# Patient Record
Sex: Male | Born: 1970 | Race: Black or African American | Hispanic: No | Marital: Single | Smoking: Former smoker
Health system: Southern US, Community
[De-identification: ages and names within clinical notes are randomized; demographics above are authoritative.]

## PROBLEM LIST (undated history)

## (undated) DIAGNOSIS — J45909 Unspecified asthma, uncomplicated: Secondary | ICD-10-CM

## (undated) DIAGNOSIS — I10 Essential (primary) hypertension: Secondary | ICD-10-CM

---

## 2005-10-06 ENCOUNTER — Emergency Department: Payer: Self-pay | Admitting: General Practice

## 2006-03-24 ENCOUNTER — Emergency Department: Payer: Self-pay | Admitting: Emergency Medicine

## 2006-03-24 ENCOUNTER — Other Ambulatory Visit: Payer: Self-pay

## 2008-06-22 ENCOUNTER — Ambulatory Visit: Payer: Self-pay | Admitting: Gastroenterology

## 2016-12-03 ENCOUNTER — Emergency Department: Payer: Self-pay

## 2016-12-03 ENCOUNTER — Encounter: Payer: Self-pay | Admitting: *Deleted

## 2016-12-03 ENCOUNTER — Other Ambulatory Visit: Payer: Self-pay

## 2016-12-03 ENCOUNTER — Emergency Department
Admission: EM | Admit: 2016-12-03 | Discharge: 2016-12-03 | Disposition: A | Discharge status: Home / Self Care | Payer: Self-pay | Attending: Emergency Medicine | Admitting: Emergency Medicine

## 2016-12-03 DIAGNOSIS — R002 Palpitations: Secondary | ICD-10-CM | POA: Insufficient documentation

## 2016-12-03 DIAGNOSIS — F17210 Nicotine dependence, cigarettes, uncomplicated: Secondary | ICD-10-CM | POA: Insufficient documentation

## 2016-12-03 DIAGNOSIS — J45909 Unspecified asthma, uncomplicated: Secondary | ICD-10-CM | POA: Insufficient documentation

## 2016-12-03 HISTORY — DX: Unspecified asthma, uncomplicated: J45.909

## 2016-12-03 LAB — TROPONIN I

## 2016-12-03 LAB — BASIC METABOLIC PANEL
ANION GAP: 9 (ref 5–15)
BUN: 11 mg/dL (ref 6–20)
CALCIUM: 9.4 mg/dL (ref 8.9–10.3)
CO2: 24 mmol/L (ref 22–32)
Chloride: 108 mmol/L (ref 101–111)
Creatinine, Ser: 1.06 mg/dL (ref 0.61–1.24)
GFR calc Af Amer: >60 mL/min (ref >60)
Glucose, Bld: 92 mg/dL (ref 65–99)
POTASSIUM: 3.8 mmol/L (ref 3.5–5.1)
SODIUM: 141 mmol/L (ref 135–145)

## 2016-12-03 LAB — CBC
HEMATOCRIT: 47.5 % (ref 40.0–52.0)
Hemoglobin: 15.4 g/dL (ref 13.0–18.0)
MCH: 29.3 pg (ref 26.0–34.0)
MCHC: 32.4 g/dL (ref 32.0–36.0)
MCV: 90.5 fL (ref 80.0–100.0)
Platelets: 205 10*3/uL (ref 150–440)
RBC: 5.26 MIL/uL (ref 4.40–5.90)
RDW: 14 % (ref 11.5–14.5)
WBC: 8.9 10*3/uL (ref 3.8–10.6)

## 2016-12-03 LAB — TSH: TSH: 1.019 u[IU]/mL (ref 0.350–4.500)

## 2016-12-03 NOTE — Discharge Instructions (Signed)
Please make an appointment with the cardiologist to be reevaluated for your palpitations and to have a Holter monitor placed.  Return to the emergency department if you develop lightheadedness or fainting, chest pain, shortness of breath, palpitations, or any other symptoms concerning to you.

## 2016-12-03 NOTE — ED Provider Notes (Signed)
[facility] Emergency Department Provider Note  ____________________________________________  Time seen: Approximately 3:11 PM  I have reviewed the triage vital signs and the nursing notes.   HISTORY  Chief Complaint Palpitations    HPI Jeremy Hodges is a 46 y.o. male with a history of asthma presenting with palpitations.  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  He then has had multiple shorter intermittent similar episodes since then.  He denies any associated lightheadedness or syncope, diaphoresis, nausea or vomiting.  No recent illness; denies drug abuse.  No lower externally swelling or calf pain.  At this time, the patient is asymptomatic.  He reports that several years ago he had an Holter monitor placed in [place] for similar symptoms and was told it was normal.  Past Medical History:  Diagnosis Date  . Asthma     There are no active problems to display for this patient.   History reviewed. No pertinent surgical history.    Allergies Patient has no allergy information on record.  History reviewed. No pertinent family history.  Social History Social History   Tobacco Use  . Smoking status: Current Every Day Smoker    Packs/day: 0.50    Types: Cigarettes  . Smokeless tobacco: Never Used  Substance Use Topics  . Alcohol use: Yes    Frequency: Never    Comment: occasionally  . Drug use: No    Review of Systems Constitutional: No fever/chills.  No lightheadedness or syncope..  No diaphoresis. Eyes: No visual changes. ENT: No sore throat. No congestion or rhinorrhea. Cardiovascular: Denies chest pain.  + palpitations. Respiratory: Denies shortness of breath.  No cough. Gastrointestinal: No abdominal pain.  No  nausea, no vomiting.  No diarrhea.  No constipation. Genitourinary: Negative for dysuria. Musculoskeletal: Negative for back pain. Skin: Negative for rash. Neurological: Negative for headaches. No focal numbness, tingling or weakness.     ____________________________________________   PHYSICAL EXAM:  VITAL SIGNS: ED Triage Vitals  Enc Vitals Group     BP 12/03/16 1203 (!) 156/98     Pulse Rate 12/03/16 1203 100     Resp 12/03/16 1203 16     Temp 12/03/16 1203 98 F (36.7 C)     Temp Source 12/03/16 1203 Oral     SpO2 12/03/16 1203 94 %     Weight 12/03/16 1204 194 lb (88 kg)     Height 12/03/16 1204 5\' 5"  (1.651 m)     Head Circumference --      Peak Flow --      Pain Score --      Pain Loc --      Pain Edu? --      Excl. in GC? --     Constitutional: Alert and oriented. Well appearing and in no acute distress. Answers questions appropriately. Eyes: Conjunctivae are normal.  EOMI. No scleral icterus. Head: Atraumatic. Nose: No congestion/rhinnorhea. Mouth/Throat: Mucous membranes are moist.  Neck: No stridor.  Supple.  No JVD.  No meningismus. Cardiovascular: Normal rate, regular rhythm. No murmurs, rubs or gallops.  Respiratory: Normal respiratory effort.  No accessory muscle use or retractions. Lungs CTAB.  No wheezes, rales or ronchi. Gastrointestinal: Soft, nontender and nondistended.  No guarding or rebound.  No peritoneal signs. Musculoskeletal: No LE edema. No ttp in the calves or palpable cords.  Negative Homan's sign. Neurologic:  A&Ox3.  Speech is clear.  Face and smile are symmetric.  EOMI.  Moves all extremities well. Skin:  Skin is warm, dry and intact. No rash noted. Psychiatric: Mood and affect are normal. Speech and behavior are normal.  Normal judgement.  ____________________________________________   LABS (all labs ordered are listed, but only abnormal results are displayed)  Labs Reviewed  BASIC METABOLIC PANEL  CBC  TROPONIN I  TSH    ____________________________________________  EKG  ED ECG REPORT I, Rockne MenghiniNorman, Anne-Caroline, the attending physician, personally viewed and interpreted this ECG.   Date: 12/03/2016  EKG Time: 1137  Rate: 100  Rhythm: sinus tachycardia  Axis: leftward  Intervals:none  ST&T Change: No STEMI  ____________________________________________  RADIOLOGY  Dg Chest 2 View  Result Date: 12/03/2016 CLINICAL DATA:  Chest pain, palpitations EXAM: CHEST  2 VIEW COMPARISON:  03/24/2006 FINDINGS: Lungs are clear.  No pleural effusion or pneumothorax. The heart is normal in size. Visualized osseous structures are within normal limits. IMPRESSION: Normal chest radiographs. Electronically Signed   By: Charline BillsSriyesh  Krishnan M.D.   On: 12/03/2016 13:04    ____________________________________________   PROCEDURES  Procedure(s) performed: None  Procedures  Critical Care performed: No ____________________________________________   INITIAL IMPRESSION / ASSESSMENT AND PLAN / ED COURSE  Pertinent labs & imaging results that were available during my care of the patient were reviewed by me and considered in my medical decision making (see chart for details).  46 y.o. male with a history of asthma presenting with multiple episodes of self resolving palpitations this morning.  Overall, the patient is hemodynamically stable.  His EKG shows a sinus tachycardia at a rate of 100, but on my examination his heart rate has normalized.  His laboratory studies are reassuring including normal electrolytes, no evidence of anemia, and a negative troponin.  His chest x-ray also does not show any acute cardiopulmonary process.  It is possible that he is having intermittent arrhythmias, including PVCs PACs or SVT, less likely intermittent atrial fibrillation.  I will speak to the cardiologist on call to have the patient evaluated for Holter placement as an outpatient.  I have also added on a TSH, which can be followed up  at the same time.  At this time, the patient is stable for discharge and remains asymptomatic.  He understands return precautions as well as follow-up instructions  ____________________________________________  FINAL CLINICAL IMPRESSION(S) / ED DIAGNOSES  Final diagnoses:  Palpitations         NEW MEDICATIONS STARTED DURING THIS VISIT:  This SmartLink is deprecated. Use AVSMEDLIST instead to display the medication list for a patient.    Rockne MenghiniNorman, Anne-Caroline, MD 12/03/16 1517

## 2016-12-03 NOTE — ED Triage Notes (Signed)
PT to ED reporting having multiple episodes  of palpitations and feeling as though "my heart was racing and beating out of my chest." Pt denies having chest pains or SOB but reports he had blurred vision and lightheadedness at one point but reports those symptoms only lasted until he sat down and rested.   Pt reports he had been drinking this weekend but denies having taken drugs recently or today. No increased stress reported.

## 2017-01-26 DIAGNOSIS — I479 Paroxysmal tachycardia, unspecified: Secondary | ICD-10-CM | POA: Insufficient documentation

## 2017-01-26 DIAGNOSIS — F17200 Nicotine dependence, unspecified, uncomplicated: Secondary | ICD-10-CM | POA: Insufficient documentation

## 2017-01-26 DIAGNOSIS — R002 Palpitations: Secondary | ICD-10-CM | POA: Insufficient documentation

## 2017-01-26 NOTE — Progress Notes (Deleted)
Cardiology Office Note  Date:  01/26/2017   ID:  Jeremy Hodges, DOB 09-Aug-1970, MRN [id number]  PCP:  Patient, No Pcp Per   No chief complaint on file.   HPI:   Jeremy Hodges is a 47 y.o. male with a history of  asthma  Smoker HTN presenting to the ER 12/03/2016 with palpitations.     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    multiple shorter intermittent similar episodes since then.   denies any associated lightheadedness or syncope, diaphoresis, nausea or vomiting.    No recent illness; denies drug abuse.  No lower externally swelling or calf pain.  At this time, the patient is asymptomatic.    He reports that several years ago he had an Holter monitor placed in [place] for similar symptoms and was told it was normal.   PMH:   has a past medical history of Asthma.  PSH:   No past surgical history on file.  No current outpatient medications on file.   No current facility-administered medications for this visit.      Allergies:   Patient has no allergy information on record.   Social History:  The patient  reports that he has been smoking cigarettes.  He has been smoking about 0.50 packs per day. he has never used smokeless tobacco. He reports that he drinks alcohol. He reports that he does not use drugs.   Family History:   family history is not on file.    Review of Systems: ROS   PHYSICAL EXAM: VS:  There were no vitals taken for this visit. , BMI There is no height or weight on file to calculate BMI. GEN: Well nourished, well developed, in no acute distress HEENT: normal Neck: no JVD, carotid bruits, or masses Cardiac: RRR; no murmurs, rubs, or gallops,no edema  Respiratory:  clear to auscultation bilaterally, normal work of breathing GI: soft, nontender,  nondistended, + BS MS: no deformity or atrophy Skin: warm and dry, no rash Neuro:  Strength and sensation are intact Psych: euthymic mood, full affect    Recent Labs: 12/03/2016: BUN 11; Creatinine, Ser 1.06; Hemoglobin 15.4; Platelets 205; Potassium 3.8; Sodium 141; TSH 1.019    Lipid Panel No results found for: CHOL, HDL, LDLCALC, TRIG    Wt Readings from Last 3 Encounters:  12/03/16 194 lb (88 kg)       ASSESSMENT AND PLAN:  No diagnosis found.   Disposition:   F/U  6 months  No orders of the defined types were placed in this encounter.    Signed, Dossie Arbourim Cresencia Asmus, M.D., Ph.D. 01/26/2017  [facility], [place] [phone]

## 2017-01-28 ENCOUNTER — Ambulatory Visit: Payer: Self-pay | Admitting: Cardiovascular Disease

## 2017-03-07 ENCOUNTER — Ambulatory Visit: Payer: Self-pay | Admitting: Cardiovascular Disease

## 2017-03-08 ENCOUNTER — Emergency Department
Admission: EM | Admit: 2017-03-08 | Discharge: 2017-03-08 | Disposition: A | Discharge status: Home / Self Care | Payer: Self-pay | Attending: Emergency Medicine | Admitting: Emergency Medicine

## 2017-03-08 ENCOUNTER — Encounter: Payer: Self-pay | Admitting: Emergency Medicine

## 2017-03-08 ENCOUNTER — Other Ambulatory Visit: Payer: Self-pay

## 2017-03-08 DIAGNOSIS — Z79899 Other long term (current) drug therapy: Secondary | ICD-10-CM | POA: Insufficient documentation

## 2017-03-08 DIAGNOSIS — T7840XA Allergy, unspecified, initial encounter: Secondary | ICD-10-CM | POA: Insufficient documentation

## 2017-03-08 DIAGNOSIS — Z91013 Allergy to seafood: Secondary | ICD-10-CM | POA: Insufficient documentation

## 2017-03-08 DIAGNOSIS — J45909 Unspecified asthma, uncomplicated: Secondary | ICD-10-CM | POA: Insufficient documentation

## 2017-03-08 DIAGNOSIS — F17210 Nicotine dependence, cigarettes, uncomplicated: Secondary | ICD-10-CM | POA: Insufficient documentation

## 2017-03-08 DIAGNOSIS — R0602 Shortness of breath: Secondary | ICD-10-CM | POA: Insufficient documentation

## 2017-03-08 LAB — COMPREHENSIVE METABOLIC PANEL
ALT: 17 U/L (ref 17–63)
ANION GAP: 8 (ref 5–15)
AST: 25 U/L (ref 15–41)
Albumin: 4.5 g/dL (ref 3.5–5.0)
Alkaline Phosphatase: 56 U/L (ref 38–126)
BUN: 10 mg/dL (ref 6–20)
CO2: 28 mmol/L (ref 22–32)
CREATININE: 1.07 mg/dL (ref 0.61–1.24)
Calcium: 9.2 mg/dL (ref 8.9–10.3)
Chloride: 104 mmol/L (ref 101–111)
Glucose, Bld: 94 mg/dL (ref 65–99)
Potassium: 4 mmol/L (ref 3.5–5.1)
SODIUM: 140 mmol/L (ref 135–145)
Total Bilirubin: 1.2 mg/dL (ref 0.3–1.2)
Total Protein: 7.7 g/dL (ref 6.5–8.1)

## 2017-03-08 LAB — CBC
HCT: 47.1 % (ref 40.0–52.0)
Hemoglobin: 15.2 g/dL (ref 13.0–18.0)
MCH: 29.1 pg (ref 26.0–34.0)
MCHC: 32.2 g/dL (ref 32.0–36.0)
MCV: 90.2 fL (ref 80.0–100.0)
PLATELETS: 200 10*3/uL (ref 150–440)
RBC: 5.22 MIL/uL (ref 4.40–5.90)
RDW: 13.7 % (ref 11.5–14.5)
WBC: 8.4 10*3/uL (ref 3.8–10.6)

## 2017-03-08 MED ORDER — PREDNISONE 20 MG PO TABS: 20.0000 mg | ORAL_TABLET | Freq: Every day | ORAL | 0 refills | Status: AC

## 2017-03-08 MED ORDER — DIPHENHYDRAMINE HCL 50 MG/ML IJ SOLN
25.0000 mg | Freq: Once | INTRAMUSCULAR | Status: AC
  Administered 2017-03-08: 25 mg via INTRAMUSCULAR

## 2017-03-08 MED ORDER — FAMOTIDINE 20 MG IN NS 100 ML IVPB
20.0000 mg | Freq: Once | INTRAVENOUS | Status: AC
  Administered 2017-03-08: 20 mg via INTRAVENOUS
  Filled 2017-03-08: qty 100

## 2017-03-08 MED ORDER — METHYLPREDNISOLONE SODIUM SUCC 125 MG IJ SOLR: 125.0000 mg | Freq: Once | INTRAMUSCULAR | Status: DC

## 2017-03-08 MED ORDER — DIPHENHYDRAMINE HCL 50 MG/ML IJ SOLN
INTRAMUSCULAR | Status: AC
  Filled 2017-03-08: qty 1

## 2017-03-08 MED ORDER — RANITIDINE HCL 300 MG PO TABS: 300.0000 mg | ORAL_TABLET | Freq: Every day | ORAL | 1 refills | Status: DC

## 2017-03-08 MED ORDER — EPINEPHRINE PF 1 MG/ML IJ SOLN
INTRAMUSCULAR | Status: AC
  Filled 2017-03-08: qty 1

## 2017-03-08 MED ORDER — PREDNISONE 20 MG PO TABS
60.0000 mg | ORAL_TABLET | Freq: Once | ORAL | Status: AC
  Administered 2017-03-08: 60 mg via ORAL
  Filled 2017-03-08: qty 3

## 2017-03-08 MED ORDER — DIPHENHYDRAMINE HCL 50 MG/ML IJ SOLN: 25.0000 mg | Freq: Once | INTRAMUSCULAR | Status: DC

## 2017-03-08 MED ORDER — METHYLPREDNISOLONE SODIUM SUCC 125 MG IJ SOLR
INTRAMUSCULAR | Status: AC
  Administered 2017-03-08: 125 mg via INTRAMUSCULAR
  Filled 2017-03-08: qty 2

## 2017-03-08 MED ORDER — METHYLPREDNISOLONE SODIUM SUCC 125 MG IJ SOLR
125.0000 mg | Freq: Once | INTRAMUSCULAR | Status: AC
  Administered 2017-03-08: 125 mg via INTRAMUSCULAR

## 2017-03-08 MED ORDER — DIPHENHYDRAMINE HCL 50 MG/ML IJ SOLN
25.0000 mg | Freq: Once | INTRAMUSCULAR | Status: DC
Start: 2017-03-08 — End: 2017-03-08

## 2017-03-08 MED ORDER — DIPHENHYDRAMINE HCL 50 MG/ML IJ SOLN
INTRAMUSCULAR | Status: AC
  Administered 2017-03-08: 25 mg via INTRAMUSCULAR
  Filled 2017-03-08: qty 1

## 2017-03-08 NOTE — ED Notes (Signed)
Pt out of bed to bathroom, feels some improved, swelling has decreased in lip and eye

## 2017-03-08 NOTE — ED Notes (Addendum)
Pt reporting N/V, SHOB and wheezing, oral swelling, swelling to left throat painful to swallow and right eye since last night, progressively worse. Pt denies taking meds.

## 2017-03-08 NOTE — Discharge Instructions (Signed)
please return for fever or worse swelling pain and shortness of breath or any other problems. If you have worsening shortness of breath call the ambulance.  In the meantime for the next 2 days take Benadryl every 6 hours. 2 of the over-the-counter pills every 6 hours. Be careful they will make you sleepy do not drive on them. Take Zantac 1 pill a day and prednisone one pill a day. The last dose of the Zantac and the Benadryl should be Sunday night. Last dose of the prednisone will be on Monday night.

## 2017-03-08 NOTE — ED Notes (Signed)
Upon entering patient's room.  Pt is lethargic from first dose of benadryl.  Pt breathing even and non-labored at this time. EDP notified that this RN uncomfortable giving second dose of benadryl at this time.  EDP okay with holding.

## 2017-03-08 NOTE — ED Triage Notes (Addendum)
Awoke with painful upper lip swelling this am. States does not take any medications. Denies injury. Voice clear. No resp distress.

## 2017-03-08 NOTE — ED Notes (Signed)
Pt remains sleepy but arouses to verbal stimuli, when awakened states "I still don't feel right, my lip hurts"

## 2017-03-08 NOTE — ED Notes (Signed)
Report given to [facility], Charity fundraiserN.  Pt moved to room 2 and care resumed by Dr. Darnelle CatalanMalinda and Bascom Levelsenise Jones, RN

## 2017-03-08 NOTE — ED Provider Notes (Signed)
[facility] Emergency Department Provider Note   ____________________________________________   First MD Initiated Contact with Patient 03/08/17 1034     (approximate)  I have reviewed the triage vital signs and the nursing notes.   HISTORY  Chief Complaint Oral Swelling   HPI Jeremy Hodges is a 47 y.o. male Patient comes from [facility] for further treatment. Patient reports he went to the hotel and [place] when he woke up in the morning had a little bump on the left upper lip. Since then and swollen around the left upper lip his right upper eyelid is swollen and he has swelling on the side of the neck. He thinks maybe he was little short of breath as well. In [facility] he got Benadryl Pepcid and site Medrol. Swelling has decreased somewhat here in the emergency room patient's voice was never horses tongue was never swollen there is no swelling underneath of the mandible.he is not running a fever and his white count is normal. Looks like he had an allergic reaction to something.  Past Medical History:  Diagnosis Date  . Asthma     Patient Active Problem List   Diagnosis Date Noted  . Palpitations 01/26/2017  . Paroxysmal tachycardia (HCC) 01/26/2017  . Smoker 01/26/2017    History reviewed. No pertinent surgical history.  Prior to Admission medications   Medication Sig Start Date End Date Taking? Authorizing Provider  predniSONE (DELTASONE) 20 MG tablet Take 1 tablet (20 mg total) by mouth daily. 03/08/17 03/08/18  Arnaldo Natal, MD  ranitidine (ZANTAC) 300 MG tablet Take 1 tablet (300 mg total) by mouth at bedtime. 03/08/17 03/08/18  Arnaldo Natal, MD    Allergies Iodine; Shrimp [shellfish allergy]; and Aspirin  No family history on file.  Social History Social History   Tobacco Use  . Smoking status: Current Every Day Smoker    Packs/day: 0.50    Types: Cigarettes  . Smokeless tobacco: Never Used  Substance Use Topics  .  Alcohol use: Yes    Frequency: Never    Comment: occasionally  . Drug use: No    Review of Systems  Constitutional: No fever/chills Eyes: No visual changes. ENT: No sore throat. Cardiovascular: Denies chest pain. Respiratory: see history of present illness Gastrointestinal: No abdominal pain.  No nausea, no vomiting.  No diarrhea.  No constipation. Genitourinary: Negative for dysuria. Musculoskeletal: Negative for back pain. Skin: Negative for rash. Neurological: Negative for headaches, focal weakness   ____________________________________________   PHYSICAL EXAM:  VITAL SIGNS: ED Triage Vitals  Enc Vitals Group     BP 03/08/17 0942 (!) 174/100     Pulse Rate 03/08/17 0942 77     Resp 03/08/17 0942 20     Temp 03/08/17 0942 98 F (36.7 C)     Temp Source 03/08/17 0942 Oral     SpO2 03/08/17 0942 99 %     Weight 03/08/17 0943 183 lb (83 kg)     Height 03/08/17 0943 5\' 5"  (1.651 m)     Head Circumference --      Peak Flow --      Pain Score 03/08/17 0943 7     Pain Loc --      Pain Edu? --      Excl. in GC? --     Constitutional: Alert and oriented. Well appearing and in no acute distress. Eyes: Conjunctivae are normal. right eyelid is somewhat edematous Head: Atraumatic.left upper lip is somewhat swollen  almost looks like he took an ACE inhibitor. Nose: No congestion/rhinnorhea. Mouth/Throat: Mucous membranes are moist.  Oropharynx non-erythematous. Neck: No stridor.   Cardiovascular: Normal rate, regular rhythm. Grossly normal heart sounds.  Good peripheral circulation. Respiratory: Normal respiratory effort.  No retractions. Lungs CTAB. Gastrointestinal: Soft and nontender. No distention. No abdominal bruits. No CVA tenderness. Musculoskeletal: No lower extremity tenderness nor edema.  No joint effusions. Neurologic:  Normal speech and language. No gross focal neurologic deficits are appreciated. No gait instability. Skin:  Skin is warm, dry and intact. No rash  noted. Psychiatric: Mood and affect are normal. Speech and behavior are normal.  ____________________________________________   LABS (all labs ordered are listed, but only abnormal results are displayed)  Labs Reviewed  CBC  COMPREHENSIVE METABOLIC PANEL   ____________________________________________  EKG   ____________________________________________  RADIOLOGY  ED MD interpretation:    Official radiology report(s): No results found.  ____________________________________________   PROCEDURES  Procedure(s) performed:   Procedures  Critical Care performed:   ____________________________________________   INITIAL IMPRESSION / ASSESSMENT AND PLAN / ED COURSE  old records were reviewed. Patient was observed in the ER for quite some time swelling improves. Patient is not worse this O2 sats do not change is not having any stridor or wheezing. I will let him go with Benadryl's Zantac and prednisone. He is not working until Monday. He seems most concerned about appearing "ugly" when he returns to work.        ____________________________________________   FINAL CLINICAL IMPRESSION(S) / ED DIAGNOSES  Final diagnoses:  Allergic reaction, initial encounter     ED Discharge Orders        Ordered    ranitidine (ZANTAC) 300 MG tablet  Daily at bedtime     03/08/17 1507    predniSONE (DELTASONE) 20 MG tablet  Daily     03/08/17 1507       Note:  This document was prepared using Dragon voice recognition software and may include unintentional dictation errors.    Arnaldo NatalMalinda, Paul F, MD 03/08/17 [phone]

## 2017-03-08 NOTE — ED Notes (Signed)
Pt alert and oriented but very sleepy, resting with eyes close, resp. Even and non labored

## 2017-03-08 NOTE — ED Notes (Signed)
Spoke with EDP, Morrie SheldonAshley regarding patient stating that [facility] and feels like his neck is also getting swollen.  VO given for another 25mg  of benadryl IV push and to move patient to higher level of care room.  Charge nurse Gearldine BienenstockBrandy, RN notified.

## 2017-03-12 NOTE — Progress Notes (Deleted)
Cardiology Office Note  Date:  03/12/2017   ID:  Jeremy Hodges, DOB 10-04-1970, MRN [id number]  PCP:  Patient, No Pcp Per   No chief complaint on file.   HPI:   Asthma Smoker HTN Tachycardia/palpitations  In the ER 12/03/2016  presenting with palpitations.   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    He then has had multiple shorter intermittent similar episodes since then.    He denies any associated lightheadedness or syncope, diaphoresis, nausea or vomiting.    everal years ago he had an Holter monitor placed in [place] for similar symptoms and was told it was normal.     PMH:   has a past medical history of Asthma.  PSH:   No past surgical history on file.  Current Outpatient Medications  Medication Sig Dispense Refill  . predniSONE (DELTASONE) 20 MG tablet Take 1 tablet (20 mg total) by mouth daily. 3 tablet 0  . ranitidine (ZANTAC) 300 MG tablet Take 1 tablet (300 mg total) by mouth at bedtime. 2 tablet 1   No current facility-administered medications for this visit.      Allergies:   Iodine; Shrimp [shellfish allergy]; and Aspirin   Social History:  The patient  reports that he has been smoking cigarettes.  He has been smoking about 0.50 packs per day. he has never used smokeless tobacco. He reports that he drinks alcohol. He reports that he does not use drugs.   Family History:   family history is not on file.    Review of Systems: ROS   PHYSICAL EXAM: VS:  There were no vitals taken for this visit. , BMI There is no height or weight on file to calculate BMI. GEN: Well nourished, well developed, in no acute distress HEENT: normal Neck: no JVD, carotid bruits, or masses Cardiac: RRR; no murmurs, rubs, or gallops,no edema  Respiratory:  clear to auscultation  bilaterally, normal work of breathing GI: soft, nontender, nondistended, + BS MS: no deformity or atrophy Skin: warm and dry, no rash Neuro:  Strength and sensation are intact Psych: euthymic mood, full affect    Recent Labs: 12/03/2016: TSH 1.019 03/08/2017: ALT 17; BUN 10; Creatinine, Ser 1.07; Hemoglobin 15.2; Platelets 200; Potassium 4.0; Sodium 140    Lipid Panel No results found for: CHOL, HDL, LDLCALC, TRIG    Wt Readings from Last 3 Encounters:  03/08/17 183 lb (83 kg)  12/03/16 194 lb (88 kg)       ASSESSMENT AND PLAN:  No diagnosis found.   Disposition:   F/U  6 months  No orders of the defined types were placed in this encounter.    Signed, Dossie Arbourim Kairen Hallinan, M.D., Ph.D. 03/12/2017  [facility], [place] [phone]

## 2017-03-14 ENCOUNTER — Ambulatory Visit: Payer: Self-pay | Admitting: Cardiovascular Disease

## 2017-03-17 ENCOUNTER — Encounter: Payer: Self-pay | Admitting: Cardiovascular Disease

## 2017-03-24 ENCOUNTER — Ambulatory Visit: Payer: Self-pay | Admitting: Cardiovascular Disease

## 2017-10-20 ENCOUNTER — Other Ambulatory Visit: Payer: Self-pay

## 2017-10-20 ENCOUNTER — Emergency Department
Admission: EM | Admit: 2017-10-20 | Discharge: 2017-10-20 | Disposition: A | Discharge status: Home / Self Care | Payer: Self-pay | Attending: Emergency Medicine | Admitting: Emergency Medicine

## 2017-10-20 DIAGNOSIS — J45909 Unspecified asthma, uncomplicated: Secondary | ICD-10-CM | POA: Insufficient documentation

## 2017-10-20 DIAGNOSIS — Z79899 Other long term (current) drug therapy: Secondary | ICD-10-CM | POA: Insufficient documentation

## 2017-10-20 DIAGNOSIS — R002 Palpitations: Secondary | ICD-10-CM | POA: Insufficient documentation

## 2017-10-20 DIAGNOSIS — F17210 Nicotine dependence, cigarettes, uncomplicated: Secondary | ICD-10-CM | POA: Insufficient documentation

## 2017-10-20 LAB — CBC
HCT: 39.7 % — ABNORMAL LOW (ref 40.0–52.0)
HEMOGLOBIN: 13.5 g/dL (ref 13.0–18.0)
MCH: 30.5 pg (ref 26.0–34.0)
MCHC: 34.1 g/dL (ref 32.0–36.0)
MCV: 89.5 fL (ref 80.0–100.0)
PLATELETS: 157 10*3/uL (ref 150–440)
RBC: 4.43 MIL/uL (ref 4.40–5.90)
RDW: 13.6 % (ref 11.5–14.5)
WBC: 7.5 10*3/uL (ref 3.8–10.6)

## 2017-10-20 LAB — BASIC METABOLIC PANEL
Anion gap: 9 (ref 5–15)
BUN: 24 mg/dL — ABNORMAL HIGH (ref 6–20)
CHLORIDE: 111 mmol/L (ref 98–111)
CO2: 23 mmol/L (ref 22–32)
CREATININE: 1.54 mg/dL — AB (ref 0.61–1.24)
Calcium: 8.8 mg/dL — ABNORMAL LOW (ref 8.9–10.3)
GFR calc non Af Amer: 53 mL/min — ABNORMAL LOW (ref >60)
Glucose, Bld: 110 mg/dL — ABNORMAL HIGH (ref 70–99)
Potassium: 3.6 mmol/L (ref 3.5–5.1)
SODIUM: 143 mmol/L (ref 135–145)

## 2017-10-20 LAB — TROPONIN I

## 2017-10-20 NOTE — ED Triage Notes (Signed)
Pt complains of palpitations and dizziness for "months". Pt was asleep prior to triage, had to be awoken for assessment. Pt denies pain and appears in no acute distress.

## 2017-10-20 NOTE — ED Notes (Signed)
Pt arrives via [facility] with c/o palpitations at work. Per EMS, pt was on the phone with his significant other and became upset. EMS reports pulse rate 112and other VS WNL. Pt is in NAD.

## 2017-10-20 NOTE — ED Provider Notes (Signed)
[facility] Emergency Department Provider Note  Time seen: 4:06 AM  I have reviewed the triage vital signs and the nursing notes.   HISTORY  Chief Complaint Palpitations    HPI Jeremy Hodges is a 47 y.o. male with a past medical history of palpitations, presents to the emergency department for heart palpitations.  According to the patient over the past 2 to 3 months he has been expensing intermittent palpitations.  States around 21 tonight when going to work palpitations became constant and more significant he became concerned so he came to the emergency department for evaluation.  Denies any chest pain.  Does state shortness of breath when the palpitations are occurring.  Denies any abdominal pain.  Largely negative review of systems.   Past Medical History:  Diagnosis Date  . Asthma     Patient Active Problem List   Diagnosis Date Noted  . Palpitations 01/26/2017  . Paroxysmal tachycardia (HCC) 01/26/2017  . Smoker 01/26/2017    No past surgical history on file.  Prior to Admission medications   Medication Sig Start Date End Date Taking? Authorizing Provider  predniSONE (DELTASONE) 20 MG tablet Take 1 tablet (20 mg total) by mouth daily. 03/08/17 03/08/18  Arnaldo Natal, MD  ranitidine (ZANTAC) 300 MG tablet Take 1 tablet (300 mg total) by mouth at bedtime. 03/08/17 03/08/18  Arnaldo Natal, MD    Allergies  Allergen Reactions  . Iodine   . Shrimp [Shellfish Allergy]   . Aspirin Rash    No family history on file.  Social History Social History   Tobacco Use  . Smoking status: Current Every Day Smoker    Packs/day: 0.50    Types: Cigarettes  . Smokeless tobacco: Never Used  Substance Use Topics  . Alcohol use: Yes    Frequency: Never    Comment: occasionally  . Drug use: No    Review of Systems Constitutional: Negative for fever. Cardiovascular: Negative for chest pain.  Positive for palpitations. Respiratory: Negative for  shortness of breath. Gastrointestinal: Negative for abdominal pain, vomiting  Genitourinary: Negative for urinary compaints Musculoskeletal: Negative for musculoskeletal complaints Skin: Negative for skin complaints  Neurological: Negative for headache All other ROS negative  ____________________________________________   PHYSICAL EXAM:  VITAL SIGNS: ED Triage Vitals [10/20/17 0111]  Enc Vitals Group     BP      Pulse      Resp      Temp      Temp src      SpO2      Weight 197 lb (89.4 kg)     Height 5\' 5"  (1.651 m)     Head Circumference      Peak Flow      Pain Score 0     Pain Loc      Pain Edu?      Excl. in GC?     Constitutional: Alert and oriented. Well appearing and in no distress. Eyes: Normal exam ENT   Head: Normocephalic and atraumatic.   Mouth/Throat: Mucous membranes are moist. Cardiovascular: Normal rate, regular rhythm. No murmur Respiratory: Normal respiratory effort without tachypnea nor retractions. Breath sounds are clear  Gastrointestinal: Soft and nontender. No distention.  Musculoskeletal: Nontender with normal range of motion in all extremities. No lower extremity tenderness or edema. Neurologic:  Normal speech and language. No gross focal neurologic deficits are appreciated. Skin:  Skin is warm, dry and intact.  Psychiatric: Mood and affect are normal. Speech and  behavior are normal.   ____________________________________________    EKG  EKG reviewed and interpreted by myself shows sinus tachycardia 105 bpm with a narrow QRS, normal axis, normal intervals, no concerning ST changes.  ____________________________________________   INITIAL IMPRESSION / ASSESSMENT AND PLAN / ED COURSE  Pertinent labs & imaging results that were available during my care of the patient were reviewed by me and considered in my medical decision making (see chart for details).  Patient presents to the emergency department for palpitations this evening.   He states when he was getting ready to go to work around 9 PM tonight he developed palpitations.  States he has been getting palpitations intermittently over the past few months but they were more severe and more constant around 9 PM last night.  Patient's labs show mild renal insufficiency otherwise largely normal.  Troponin is negative.  EKG is reassuring.  No ectopic beats auscultated during my examination.  He states he is feeling much better.  I discussed with the patient limiting caffeine intake avoiding alcohol and getting more sleep.  Patient states he does not drink alcohol much, does not use much caffeine but he only gets several hours asleep at night, which could definitely be contributing.  We will have the patient follow-up with cardiology for a Holter monitor.  Patient agreeable to plan of care.  Discussed return precautions for any chest pain, lightheadedness/dizziness.  ____________________________________________   FINAL CLINICAL IMPRESSION(S) / ED DIAGNOSES  Palpitations    Minna Antis, MD 10/20/17 [phone]

## 2018-01-01 ENCOUNTER — Emergency Department
Admission: EM | Admit: 2018-01-01 | Discharge: 2018-01-01 | Disposition: A | Discharge status: Home / Self Care | Payer: Self-pay | Attending: Emergency Medicine | Admitting: Emergency Medicine

## 2018-01-01 ENCOUNTER — Other Ambulatory Visit: Payer: Self-pay

## 2018-01-01 ENCOUNTER — Encounter: Payer: Self-pay | Admitting: Emergency Medicine

## 2018-01-01 DIAGNOSIS — K922 Gastrointestinal hemorrhage, unspecified: Secondary | ICD-10-CM | POA: Insufficient documentation

## 2018-01-01 DIAGNOSIS — J45909 Unspecified asthma, uncomplicated: Secondary | ICD-10-CM | POA: Insufficient documentation

## 2018-01-01 DIAGNOSIS — F17210 Nicotine dependence, cigarettes, uncomplicated: Secondary | ICD-10-CM | POA: Insufficient documentation

## 2018-01-01 LAB — COMPREHENSIVE METABOLIC PANEL
ALT: 17 U/L (ref 0–44)
ANION GAP: 6 (ref 5–15)
AST: 23 U/L (ref 15–41)
Albumin: 4.1 g/dL (ref 3.5–5.0)
Alkaline Phosphatase: 53 U/L (ref 38–126)
BUN: 15 mg/dL (ref 6–20)
CHLORIDE: 104 mmol/L (ref 98–111)
CO2: 27 mmol/L (ref 22–32)
Calcium: 8.6 mg/dL — ABNORMAL LOW (ref 8.9–10.3)
Creatinine, Ser: 0.99 mg/dL (ref 0.61–1.24)
GFR calc non Af Amer: >60 mL/min (ref >60)
Glucose, Bld: 88 mg/dL (ref 70–99)
POTASSIUM: 4 mmol/L (ref 3.5–5.1)
SODIUM: 137 mmol/L (ref 135–145)
TOTAL PROTEIN: 6.7 g/dL (ref 6.5–8.1)
Total Bilirubin: 0.6 mg/dL (ref 0.3–1.2)

## 2018-01-01 LAB — CBC WITH DIFFERENTIAL/PLATELET
Abs Immature Granulocytes: 0.02 10*3/uL (ref 0.00–0.07)
Basophils Absolute: 0 10*3/uL (ref 0.0–0.1)
Basophils Relative: 1 %
EOS PCT: 1 %
Eosinophils Absolute: 0.1 10*3/uL (ref 0.0–0.5)
HCT: 45.1 % (ref 39.0–52.0)
Hemoglobin: 14.7 g/dL (ref 13.0–17.0)
IMMATURE GRANULOCYTES: 0 %
LYMPHS PCT: 34 %
Lymphs Abs: 2.1 10*3/uL (ref 0.7–4.0)
MCH: 28.7 pg (ref 26.0–34.0)
MCHC: 32.6 g/dL (ref 30.0–36.0)
MCV: 88.1 fL (ref 80.0–100.0)
MONO ABS: 0.4 10*3/uL (ref 0.1–1.0)
Monocytes Relative: 7 %
Neutro Abs: 3.5 10*3/uL (ref 1.7–7.7)
Neutrophils Relative %: 57 %
Platelets: 191 10*3/uL (ref 150–400)
RBC: 5.12 MIL/uL (ref 4.22–5.81)
RDW: 14.2 % (ref 11.5–15.5)
WBC: 6.1 10*3/uL (ref 4.0–10.5)
nRBC: 0 % (ref 0.0–0.2)

## 2018-01-01 MED ORDER — PSYLLIUM 0.52 G PO CAPS: 0.5200 g | ORAL_CAPSULE | Freq: Every day | ORAL | 0 refills | Status: DC

## 2018-01-01 NOTE — ED Notes (Signed)
AAOx3.  Skin warm and dry.  NAD 

## 2018-01-01 NOTE — ED Provider Notes (Signed)
[facility] Emergency Department Provider Note  ___________________________________________   First MD Initiated Contact with Patient 01/01/18 1018     (approximate)  I have reviewed the triage vital signs and the nursing notes.   HISTORY  Chief Complaint Rectal Bleeding   HPI Jeremy Hodges is a 47 y.o. male with a history of asthma as well as palpitations who has had previous bleeding with bowel movements was presented to the emergency department with bright red blood on his toilet tissue as well as red blood on the tip of his stool x1 last night.  He says that he has not had persistent dripping or bleeding from his bottom and has not had any black or grossly bloody stools, just blood coating the stool.  He says that he has had a history of "polyps" from previous colonoscopy.  Does not take any medications.  Says that he has constipation intermittently but does not take any medication for this either.  Does not report any lightheadedness.   Past Medical History:  Diagnosis Date  . Asthma     Patient Active Problem List   Diagnosis Date Noted  . Palpitations 01/26/2017  . Paroxysmal tachycardia (HCC) 01/26/2017  . Smoker 01/26/2017    History reviewed. No pertinent surgical history.  Prior to Admission medications   Medication Sig Start Date End Date Taking? Authorizing Provider  predniSONE (DELTASONE) 20 MG tablet Take 1 tablet (20 mg total) by mouth daily. Patient not taking: Reported on 01/01/2018 03/08/17 03/08/18  Arnaldo Natal, MD  ranitidine (ZANTAC) 300 MG tablet Take 1 tablet (300 mg total) by mouth at bedtime. Patient not taking: Reported on 01/01/2018 03/08/17 03/08/18  Arnaldo Natal, MD    Allergies Iodine; Shrimp [shellfish allergy]; and Aspirin  No family history on file.  Social History Social History   Tobacco Use  . Smoking status: Current Every Day Smoker    Packs/day: 0.50    Types: Cigarettes  . Smokeless tobacco: Never  Used  Substance Use Topics  . Alcohol use: Yes    Frequency: Never    Comment: occasionally  . Drug use: No    Review of Systems  Constitutional: No fever/chills Eyes: No visual changes. ENT: No sore throat. Cardiovascular: Denies chest pain. Respiratory: Denies shortness of breath. Gastrointestinal: No abdominal pain.  No nausea, no vomiting.  No diarrhea.   Genitourinary: Negative for dysuria. Musculoskeletal: Negative for back pain. Skin: Negative for rash. Neurological: Negative for headaches, focal weakness or numbness.   ____________________________________________   PHYSICAL EXAM:  VITAL SIGNS: ED Triage Vitals  Enc Vitals Group     BP 01/01/18 0955 (!) 170/102     Pulse Rate 01/01/18 0955 75     Resp 01/01/18 0955 18     Temp 01/01/18 0955 97.9 F (36.6 C)     Temp Source 01/01/18 0955 Oral     SpO2 01/01/18 0955 99 %     Weight 01/01/18 0956 192 lb (87.1 kg)     Height 01/01/18 0956 5\' 5"  (1.651 m)     Head Circumference --      Peak Flow --      Pain Score 01/01/18 0956 0     Pain Loc --      Pain Edu? --      Excl. in GC? --     Constitutional: Alert and oriented. Well appearing and in no acute distress. Eyes: Conjunctivae are normal.  Head: Atraumatic. Nose: No congestion/rhinnorhea. Mouth/Throat: Mucous membranes  are moist.  Neck: No stridor.   Cardiovascular: Normal rate, regular rhythm. Grossly normal heart sounds.   Respiratory: Normal respiratory effort.  No retractions. Lungs CTAB. Gastrointestinal: Soft and nontender. No distention.  Rectal exam externally without any lesions or hemorrhoids.  No fissures.  Very small amount of stool on the glove, no gross blood in the stool.  Does not appear black.  However, is heme positive Musculoskeletal: No lower extremity tenderness nor edema.  No joint effusions. Neurologic:  Normal speech and language. No gross focal neurologic deficits are appreciated. Skin:  Skin is warm, dry and intact. No rash  noted. Psychiatric: Mood and affect are normal. Speech and behavior are normal.  ____________________________________________   LABS (all labs ordered are listed, but only abnormal results are displayed)  Labs Reviewed  COMPREHENSIVE METABOLIC PANEL - Abnormal; Notable for the following components:      Result Value   Calcium 8.6 (*)    All other components within normal limits  CBC WITH DIFFERENTIAL/PLATELET   ____________________________________________  EKG   ____________________________________________  RADIOLOGY   ____________________________________________   PROCEDURES  Procedure(s) performed:   Procedures  Critical Care performed:   ____________________________________________   INITIAL IMPRESSION / ASSESSMENT AND PLAN / ED COURSE  Pertinent labs & imaging results that were available during my care of the patient were reviewed by me and considered in my medical decision making (see chart for details).  DDX: Internal hemorrhoids, bleeding polyp, GI bleeding As part of my medical decision making, I reviewed the following data within the electronic MEDICAL RECORD NUMBER Notes from prior ED visits  Patient is well-appearing with normal hemoglobin.  Vital signs are normal without hypotension or a rapid heart rate.  Patient to be referred to gastroenterology.  We discussed the findings on his exam as well as the necessary follow-up and he is understanding and willing to comply. ____________________________________________   FINAL CLINICAL IMPRESSION(S) / ED DIAGNOSES  Gastrointestinal bleeding.  NEW MEDICATIONS STARTED DURING THIS VISIT:  New Prescriptions   No medications on file     Note:  This document was prepared using Dragon voice recognition software and may include unintentional dictation errors.     Myrna BlazerSchaevitz, Maybelle Depaoli Matthew, MD 01/01/18 [phone]

## 2018-01-01 NOTE — ED Triage Notes (Signed)
Pt states blood in his stool noted around midnight, denies constipation, denies abd pain, nad.

## 2020-02-15 ENCOUNTER — Emergency Department
Admission: EM | Admit: 2020-02-15 | Discharge: 2020-02-15 | Disposition: A | Discharge status: Home / Self Care | Payer: Self-pay | Attending: Emergency Medicine | Admitting: Emergency Medicine

## 2020-02-15 ENCOUNTER — Other Ambulatory Visit: Payer: Self-pay

## 2020-02-15 ENCOUNTER — Emergency Department: Payer: Self-pay

## 2020-02-15 DIAGNOSIS — R002 Palpitations: Secondary | ICD-10-CM | POA: Insufficient documentation

## 2020-02-15 DIAGNOSIS — F17210 Nicotine dependence, cigarettes, uncomplicated: Secondary | ICD-10-CM | POA: Insufficient documentation

## 2020-02-15 DIAGNOSIS — J45909 Unspecified asthma, uncomplicated: Secondary | ICD-10-CM | POA: Insufficient documentation

## 2020-02-15 LAB — BASIC METABOLIC PANEL
Anion gap: 11 (ref 5–15)
BUN: 14 mg/dL (ref 6–20)
CO2: 24 mmol/L (ref 22–32)
Calcium: 9 mg/dL (ref 8.9–10.3)
Chloride: 104 mmol/L (ref 98–111)
Creatinine, Ser: 0.97 mg/dL (ref 0.61–1.24)
GFR, Estimated: >60 mL/min (ref >60)
Glucose, Bld: 90 mg/dL (ref 70–99)
Potassium: 4 mmol/L (ref 3.5–5.1)
Sodium: 139 mmol/L (ref 135–145)

## 2020-02-15 LAB — CBC
HCT: 43.9 % (ref 39.0–52.0)
Hemoglobin: 14.2 g/dL (ref 13.0–17.0)
MCH: 28.9 pg (ref 26.0–34.0)
MCHC: 32.3 g/dL (ref 30.0–36.0)
MCV: 89.2 fL (ref 80.0–100.0)
Platelets: 202 10*3/uL (ref 150–400)
RBC: 4.92 MIL/uL (ref 4.22–5.81)
RDW: 13.5 % (ref 11.5–15.5)
WBC: 6.2 10*3/uL (ref 4.0–10.5)
nRBC: 0 % (ref 0.0–0.2)

## 2020-02-15 LAB — TROPONIN I (HIGH SENSITIVITY): Troponin I (High Sensitivity): 4 ng/L (ref <18)

## 2020-02-15 NOTE — ED Triage Notes (Signed)
Pt comes via POV from work with c/o palpitations and some mouth numbness. Pt states no palpitations at this time.   Pt states it was just weird and unsure what was going on.  Pt denies any CP or SOb

## 2020-02-15 NOTE — ED Notes (Signed)
See triage note  Presents with some heart palpitations this am  Also has had some numbness around mouth   No facial drooping noted

## 2020-02-15 NOTE — ED Provider Notes (Signed)
[facility] Emergency Department Provider Note   ____________________________________________    I have reviewed the triage vital signs and the nursing notes.   HISTORY  Chief Complaint Palpitations     HPI Jeremy Hodges is a 50 y.o. male who presents with complaints of palpitations.  Patient reports he was at work when he started having palpitations and feeling his heart was racing.  He reports the symptom seems to happen when he gets "excited ".  He felt numbness around his lips.  Symptoms have resolved, symptoms lasted only approximately 5 minutes.  He reports this happened once years ago.  Does not take any medications for this.  No fevers or chills.  No chest pain.  No cough.  Past Medical History:  Diagnosis Date  . Asthma     Patient Active Problem List   Diagnosis Date Noted  . Palpitations 01/26/2017  . Paroxysmal tachycardia (HCC) 01/26/2017  . Smoker 01/26/2017    History reviewed. No pertinent surgical history.  Prior to Admission medications   Medication Sig Start Date End Date Taking? Authorizing Provider  psyllium (METAMUCIL) 0.52 g capsule Take 1 capsule (0.52 g total) by mouth daily. 01/01/18   Myrna Blazer, MD     Allergies Iodine, Shrimp [shellfish allergy], and Aspirin  No family history on file.  Social History Social History   Tobacco Use  . Smoking status: Current Every Day Smoker    Packs/day: 0.50    Types: Cigarettes  . Smokeless tobacco: Never Used  Substance Use Topics  . Alcohol use: Yes    Comment: occasionally  . Drug use: No    Review of Systems  Constitutional: No fever/chills Eyes: No visual changes.  ENT: No sore throat. Cardiovascular: As above Respiratory: Denies shortness of breath. Gastrointestinal: No abdominal pain. Genitourinary: Negative for dysuria. Musculoskeletal: Negative for back pain. Skin: Negative for rash. Neurological: Negative for headaches     ____________________________________________   PHYSICAL EXAM:  VITAL SIGNS: ED Triage Vitals  Enc Vitals Group     BP 02/15/20 1046 (!) 172/116     Pulse Rate 02/15/20 1046 82     Resp 02/15/20 1046 18     Temp 02/15/20 1046 98.2 F (36.8 C)     Temp Source 02/15/20 1327 Oral     SpO2 02/15/20 1046 100 %     Weight 02/15/20 1418 87 kg (191 lb 12.8 oz)     Height 02/15/20 1418 1.651 m (5\' 5" )     Head Circumference --      Peak Flow --      Pain Score 02/15/20 1044 0     Pain Loc --      Pain Edu? --      Excl. in GC? --     Constitutional: Alert and oriented.   Nose: No congestion/rhinnorhea. Mouth/Throat: Mucous membranes are moist.    Cardiovascular: Normal rate, regular rhythm. Grossly normal heart sounds.  Good peripheral circulation. Respiratory: Normal respiratory effort.  No retractions. Lungs CTAB. Gastrointestinal: Soft and nontender. No distention.    Musculoskeletal: No lower extremity tenderness nor edema.  Warm and well perfused Neurologic:  Normal speech and language. No gross focal neurologic deficits are appreciated.  Skin:  Skin is warm, dry and intact. No rash noted. Psychiatric: Mood and affect are normal. Speech and behavior are normal.  ____________________________________________   LABS (all labs ordered are listed, but only abnormal results are displayed)  Labs Reviewed  BASIC METABOLIC PANEL  CBC  TROPONIN I (HIGH SENSITIVITY)   ____________________________________________  EKG  ED ECG REPORT I, Jene Every, the attending physician, personally viewed and interpreted this ECG.  Date: 02/15/2020  Rhythm: normal sinus rhythm QRS Axis: normal Intervals: normal ST/T Wave abnormalities: normal Narrative Interpretation: no evidence of acute ischemia  ____________________________________________  RADIOLOGY  Chest x-ray reviewed by me, no infiltrate or  effusion ____________________________________________   PROCEDURES  Procedure(s) performed: No  Procedures   Critical Care performed: No ____________________________________________   INITIAL IMPRESSION / ASSESSMENT AND PLAN / ED COURSE  Pertinent labs & imaging results that were available during my care of the patient were reviewed by me and considered in my medical decision making (see chart for details).  Patient presents with palpitations as discussed above, symptoms resolved, asymptomatic here and feels quite well with no complaints.  EKG is reassuring.  High-sensitivity  troponin is normal.  Kidney function and CBC is normal.  Patient reports this is happened in the past and sounds like he has worn a monitor in the past.  Given that he is asymptomatic feels well at this time, no concerning symptoms, reassuring work-up, appropriate for discharge with outpatient follow-up with cardiology for likely Holter monitoring again    ____________________________________________   FINAL CLINICAL IMPRESSION(S) / ED DIAGNOSES  Final diagnoses:  Palpitations        Note:  This document was prepared using Dragon voice recognition software and may include unintentional dictation errors.   Jene Every, MD 02/15/20 [phone]

## 2020-06-08 ENCOUNTER — Encounter: Payer: Self-pay | Admitting: Emergency Medicine

## 2020-06-08 ENCOUNTER — Emergency Department: Payer: Self-pay

## 2020-06-08 ENCOUNTER — Emergency Department
Admission: EM | Admit: 2020-06-08 | Discharge: 2020-06-08 | Disposition: A | Discharge status: Home / Self Care | Payer: Self-pay | Attending: Emergency Medicine | Admitting: Emergency Medicine

## 2020-06-08 ENCOUNTER — Other Ambulatory Visit: Payer: Self-pay

## 2020-06-08 DIAGNOSIS — M549 Dorsalgia, unspecified: Secondary | ICD-10-CM | POA: Insufficient documentation

## 2020-06-08 DIAGNOSIS — J45909 Unspecified asthma, uncomplicated: Secondary | ICD-10-CM | POA: Insufficient documentation

## 2020-06-08 DIAGNOSIS — R0781 Pleurodynia: Secondary | ICD-10-CM | POA: Insufficient documentation

## 2020-06-08 DIAGNOSIS — R0789 Other chest pain: Secondary | ICD-10-CM

## 2020-06-08 DIAGNOSIS — F17210 Nicotine dependence, cigarettes, uncomplicated: Secondary | ICD-10-CM | POA: Insufficient documentation

## 2020-06-08 DIAGNOSIS — R0602 Shortness of breath: Secondary | ICD-10-CM | POA: Insufficient documentation

## 2020-06-08 DIAGNOSIS — R059 Cough, unspecified: Secondary | ICD-10-CM | POA: Insufficient documentation

## 2020-06-08 MED ORDER — TRAMADOL HCL 50 MG PO TABS: 50.0000 mg | ORAL_TABLET | Freq: Four times a day (QID) | ORAL | 0 refills | Status: DC | PRN

## 2020-06-08 MED ORDER — KETOROLAC TROMETHAMINE 30 MG/ML IJ SOLN
30.0000 mg | Freq: Once | INTRAMUSCULAR | Status: AC
  Administered 2020-06-08: 30 mg via INTRAMUSCULAR
  Filled 2020-06-08: qty 1

## 2020-06-08 NOTE — ED Provider Notes (Signed)
[facility] Emergency Department Provider Note  ____________________________________________   Event Date/Time   First MD Initiated Contact with Patient 06/08/20 1134     (approximate)  I have reviewed the triage vital signs and the nursing notes.   HISTORY  Chief Complaint Back Pain    HPI Jeremy Hodges is a 50 y.o. male presents emergency department complaining of left-sided rib pain.  Patient states he was at work and coughed and felt a sharp pop in the left side.  Pain started after the cough.  Some difficulty breathing.  No fever or chills recently.  No recent exposure to COVID that he knows of.    Past Medical History:  Diagnosis Date  . Asthma     Patient Active Problem List   Diagnosis Date Noted  . Palpitations 01/26/2017  . Paroxysmal tachycardia (HCC) 01/26/2017  . Smoker 01/26/2017    History reviewed. No pertinent surgical history.  Prior to Admission medications   Medication Sig Start Date End Date Taking? Authorizing Provider  traMADol (ULTRAM) 50 MG tablet Take 1 tablet (50 mg total) by mouth every 6 (six) hours as needed. 06/08/20  Yes Tex Conroy, Roselyn Bering, PA-C    Allergies Iodine, Shrimp [shellfish allergy], and Aspirin  No family history on file.  Social History Social History   Tobacco Use  . Smoking status: Current Every Day Smoker    Packs/day: 0.50    Types: Cigarettes  . Smokeless tobacco: Never Used  Substance Use Topics  . Alcohol use: Yes    Comment: occasionally  . Drug use: No    Review of Systems  Constitutional: No fever/chills Eyes: No visual changes. ENT: No sore throat. Respiratory: Positive cough and left rib pain Cardiovascular: Denies chest pain Gastrointestinal: Denies abdominal pain Genitourinary: Negative for dysuria. Musculoskeletal: Negative for back pain. Skin: Negative for rash. Psychiatric: no mood changes,     ____________________________________________   PHYSICAL  EXAM:  VITAL SIGNS: ED Triage Vitals  Enc Vitals Group     BP 06/08/20 1133 (!) 163/111     Pulse Rate 06/08/20 1133 82     Resp 06/08/20 1133 18     Temp 06/08/20 1133 98.3 F (36.8 C)     Temp Source 06/08/20 1133 Oral     SpO2 06/08/20 1133 97 %     Weight 06/08/20 1104 191 lb 12.8 oz (87 kg)     Height 06/08/20 1104 5\' 5"  (1.651 m)     Head Circumference --      Peak Flow --      Pain Score 06/08/20 1104 6     Pain Loc --      Pain Edu? --      Excl. in GC? --     Constitutional: Alert and oriented. Well appearing and in no acute distress. Eyes: Conjunctivae are normal.  Head: Atraumatic. Nose: No congestion/rhinnorhea. Mouth/Throat: Mucous membranes are moist.   Neck:  supple no lymphadenopathy noted Cardiovascular: Normal rate, regular rhythm. Heart sounds are normal Respiratory: Normal respiratory effort.  No retractions, lungs c t a, left ribs are tender, patient is able to speak in full sentences GU: deferred Musculoskeletal: FROM all extremities, warm and well perfused Neurologic:  Normal speech and language.  Skin:  Skin is warm, dry and intact. No rash noted. Psychiatric: Mood and affect are normal. Speech and behavior are normal.  ____________________________________________   LABS (all labs ordered are listed, but only abnormal results are displayed)  Labs Reviewed - No  data to display ____________________________________________   ____________________________________________  RADIOLOGY  Left ribs and chest  ____________________________________________   PROCEDURES  Procedure(s) performed: No  Procedures    ____________________________________________   INITIAL IMPRESSION / ASSESSMENT AND PLAN / ED COURSE  Pertinent labs & imaging results that were available during my care of the patient were reviewed by me and considered in my medical decision making (see chart for details).   Patient is 50 year old male presents with left rib pain.   See HPI.  Physical exam shows patient be stable at this time  X-ray of the left ribs and chest   Patient states he can take Toradol without any problems.  Is given Toradol 30 mg IM  Chest x-rays reviewed by me confirmed by radiology to be negative for any acute abnormality.  No fractured ribs are noted.,  No pneumothorax is noted  Did explain findings to the patient.  He was given prescription for tramadol as he is allergic to aspirin.  Can also take Tylenol.  He states he can take Tylenol has not had any problem with that before so I did give him a Toradol injection.  He was discharged stable condition.   Jeremy Hodges was evaluated in Emergency Department on 06/08/2020 for the symptoms described in the history of present illness. He was evaluated in the context of the global COVID-19 pandemic, which necessitated consideration that the patient might be at risk for infection with the SARS-CoV-2 virus that causes COVID-19. Institutional protocols and algorithms that pertain to the evaluation of patients at risk for COVID-19 are in a state of rapid change based on information released by regulatory bodies including the CDC and federal and state organizations. These policies and algorithms were followed during the patient's care in the ED.    As part of my medical decision making, I reviewed the following data within the electronic MEDICAL RECORD NUMBER Nursing notes reviewed and incorporated, Old chart reviewed, Radiograph reviewed , Notes from prior ED visits and [place] Controlled Substance Database  ____________________________________________   FINAL CLINICAL IMPRESSION(S) / ED DIAGNOSES  Final diagnoses:  Chest wall pain      NEW MEDICATIONS STARTED DURING THIS VISIT:  New Prescriptions   TRAMADOL (ULTRAM) 50 MG TABLET    Take 1 tablet (50 mg total) by mouth every 6 (six) hours as needed.     Note:  This document was prepared using Dragon voice recognition software and may include  unintentional dictation errors.    Faythe Ghee, PA-C 06/08/20 1321    Sharman Cheek, MD 06/08/20 [phone]

## 2020-06-08 NOTE — ED Notes (Signed)
See triage note  Presents with pain to mid back   States pain started after coughing this am  No fever  Ambulates well

## 2020-06-08 NOTE — Discharge Instructions (Signed)
Follow-up with your regular doctor if not improving to 3 days.  Return emergency department worsening You do not have a broken rib or collapsed lung. Apply ice to the area that hurts. Presents to take the pain medication as needed.  May also take Tylenol

## 2020-06-08 NOTE — ED Triage Notes (Signed)
C/O thoracic back pain.  Pain started today, just PTA, after patient coughed.  States he felt something 'pop'.  AOx3.  Skin warm and dry. NAD

## 2020-09-27 ENCOUNTER — Other Ambulatory Visit: Payer: Self-pay

## 2020-09-27 ENCOUNTER — Emergency Department
Admission: EM | Admit: 2020-09-27 | Discharge: 2020-09-27 | Disposition: A | Discharge status: Home / Self Care | Payer: Self-pay | Attending: Emergency Medicine | Admitting: Emergency Medicine

## 2020-09-27 DIAGNOSIS — J45909 Unspecified asthma, uncomplicated: Secondary | ICD-10-CM | POA: Insufficient documentation

## 2020-09-27 DIAGNOSIS — F17210 Nicotine dependence, cigarettes, uncomplicated: Secondary | ICD-10-CM | POA: Insufficient documentation

## 2020-09-27 DIAGNOSIS — Z20822 Contact with and (suspected) exposure to covid-19: Secondary | ICD-10-CM | POA: Insufficient documentation

## 2020-09-27 DIAGNOSIS — I10 Essential (primary) hypertension: Secondary | ICD-10-CM | POA: Insufficient documentation

## 2020-09-27 LAB — RESP PANEL BY RT-PCR (FLU A&B, COVID) ARPGX2
Influenza A by PCR: NEGATIVE
Influenza B by PCR: NEGATIVE
SARS Coronavirus 2 by RT PCR: NEGATIVE

## 2020-09-27 MED ORDER — AMLODIPINE BESYLATE 5 MG PO TABS: 5.0000 mg | ORAL_TABLET | Freq: Every day | ORAL | 11 refills | Status: AC

## 2020-09-27 NOTE — Discharge Instructions (Addendum)
Follow-up with a family practice physician.  Several phone numbers are listed on your discharge papers.  Or find one that your insurance will cover.  If you do not have insurance I would suggest using the [facility] or [place] [facility] services. Take the blood pressure medication as prescribed.  You should have the blood pressure rechecked in 1 week.  Return emergency department worsening

## 2020-09-27 NOTE — ED Triage Notes (Signed)
Pt c/o slight cough with SOB today, states his employer wanted him to have a covid test

## 2020-09-27 NOTE — ED Notes (Signed)
See triage note  Presents with some SOB this am   States he did have some slight h/a yesterday  No fever or cough

## 2020-09-27 NOTE — ED Provider Notes (Signed)
[facility] Emergency Department Provider Note  ____________________________________________   Event Date/Time   First MD Initiated Contact with Patient 09/27/20 [phone]     (approximate)  I have reviewed the triage vital signs and the nursing notes.   HISTORY  Chief Complaint URI    HPI Jeremy Hodges is a 50 y.o. male presents emergency department complaining of some shortness of breath due to the dust in his facility this morning.  Patient's employer got concerned and told him he needed to have a COVID test prior to returning to work.  Patient is a smoker.  States he drank too much alcohol last night.  No fever or chills today.  He denies chest pain  Past Medical History:  Diagnosis Date   Asthma     Patient Active Problem List   Diagnosis Date Noted   Palpitations 01/26/2017   Paroxysmal tachycardia (HCC) 01/26/2017   Smoker 01/26/2017    History reviewed. No pertinent surgical history.  Prior to Admission medications   Medication Sig Start Date End Date Taking? Authorizing Provider  amLODipine (NORVASC) 5 MG tablet Take 1 tablet (5 mg total) by mouth daily. 09/27/20 09/27/21 Yes Litsy Epting, Roselyn Bering, PA-C    Allergies Iodine, Shrimp [shellfish allergy], and Aspirin  No family history on file.  Social History Social History   Tobacco Use   Smoking status: Every Day    Packs/day: 0.50    Types: Cigarettes   Smokeless tobacco: Never  Substance Use Topics   Alcohol use: Yes    Comment: occasionally   Drug use: No    Review of Systems  Constitutional: No fever/chills Eyes: No visual changes. ENT: No sore throat. Respiratory: Denies cough Cardiovascular: Denies chest pain Gastrointestinal: Denies abdominal pain Genitourinary: Negative for dysuria. Musculoskeletal: Negative for back pain. Skin: Negative for rash. Psychiatric: no mood changes,     ____________________________________________   PHYSICAL EXAM:  VITAL SIGNS: ED  Triage Vitals  Enc Vitals Group     BP 09/27/20 0746 (!) 172/111     Pulse Rate 09/27/20 0746 80     Resp 09/27/20 0746 20     Temp 09/27/20 0746 98.5 F (36.9 C)     Temp Source 09/27/20 0746 Oral     SpO2 09/27/20 0746 98 %     Weight 09/27/20 0744 191 lb 12.8 oz (87 kg)     Height 09/27/20 0744 5\' 5"  (1.651 m)     Head Circumference --      Peak Flow --      Pain Score 09/27/20 0740 0     Pain Loc --      Pain Edu? --      Excl. in GC? --     Constitutional: Alert and oriented. Well appearing and in no acute distress. Eyes: Conjunctivae are normal.  Head: Atraumatic. Nose: No congestion/rhinnorhea. Mouth/Throat: Mucous membranes are moist.   Neck:  supple no lymphadenopathy noted Cardiovascular: Normal rate, regular rhythm. Heart sounds are normal Respiratory: Normal respiratory effort.  No retractions, lungs c t a  GU: deferred Musculoskeletal: FROM all extremities, warm and well perfused Neurologic:  Normal speech and language.  Skin:  Skin is warm, dry and intact. No rash noted. Psychiatric: Mood and affect are normal. Speech and behavior are normal.  ____________________________________________   LABS (all labs ordered are listed, but only abnormal results are displayed)  Labs Reviewed  RESP PANEL BY RT-PCR (FLU A&B, COVID) [id number]   ____________________________________________   ____________________________________________  RADIOLOGY  ____________________________________________   PROCEDURES  Procedure(s) performed: No  Procedures    ____________________________________________   INITIAL IMPRESSION / ASSESSMENT AND PLAN / ED COURSE  Pertinent labs & imaging results that were available during my care of the patient were reviewed by me and considered in my medical decision making (see chart for details).   Patient is a 50 year old male presents emergency department for COVID test.  See HPI.  Physical exam shows patient to have elevated blood  pressure.  In the patient's past medical records it does show that his blood pressure has continuously been elevated over the past 7 months.  I do feel that due to him being African-American, smoker, alcohol user, that he needs to go ahead and be placed on blood pressure medication.  We had a long discussion about hypertension, its causes, its damages to the body etc.  We did discuss smoking cessation as I told him this was one of the largest culprits for elevated blood pressure.  We discussed diet modifications.  He is to follow-up with a family practice physician.  Discharged in stable condition.  Given a prescription for amlodipine 5 mg.  He is to have the blood pressure rechecked in 1 week     Jeremy Hodges was evaluated in Emergency Department on 09/27/2020 for the symptoms described in the history of present illness. He was evaluated in the context of the global COVID-19 pandemic, which necessitated consideration that the patient might be at risk for infection with the SARS-CoV-2 virus that causes COVID-19. Institutional protocols and algorithms that pertain to the evaluation of patients at risk for COVID-19 are in a state of rapid change based on information released by regulatory bodies including the CDC and federal and state organizations. These policies and algorithms were followed during the patient's care in the ED.    As part of my medical decision making, I reviewed the following data within the electronic MEDICAL RECORD NUMBER Nursing notes reviewed and incorporated, Labs reviewed , Old chart reviewed, Notes from prior ED visits, and [place] Controlled Substance Database  ____________________________________________   FINAL CLINICAL IMPRESSION(S) / ED DIAGNOSES  Final diagnoses:  Hypertension, unspecified type  Suspected COVID-19 virus infection      NEW MEDICATIONS STARTED DURING THIS VISIT:  New Prescriptions   AMLODIPINE (NORVASC) 5 MG TABLET    Take 1 tablet (5 mg total) by mouth daily.      Note:  This document was prepared using Dragon voice recognition software and may include unintentional dictation errors.    Faythe Ghee, PA-C 09/27/20 7782    Gilles Chiquito, MD 09/27/20 1455

## 2020-09-28 ENCOUNTER — Emergency Department: Payer: Self-pay

## 2020-09-28 ENCOUNTER — Emergency Department
Admission: EM | Admit: 2020-09-28 | Discharge: 2020-09-28 | Disposition: A | Discharge status: Home / Self Care | Payer: Self-pay | Attending: Emergency Medicine | Admitting: Emergency Medicine

## 2020-09-28 ENCOUNTER — Other Ambulatory Visit: Payer: Self-pay

## 2020-09-28 DIAGNOSIS — F17210 Nicotine dependence, cigarettes, uncomplicated: Secondary | ICD-10-CM | POA: Insufficient documentation

## 2020-09-28 DIAGNOSIS — J45909 Unspecified asthma, uncomplicated: Secondary | ICD-10-CM | POA: Insufficient documentation

## 2020-09-28 DIAGNOSIS — I10 Essential (primary) hypertension: Secondary | ICD-10-CM | POA: Insufficient documentation

## 2020-09-28 DIAGNOSIS — R200 Anesthesia of skin: Secondary | ICD-10-CM | POA: Insufficient documentation

## 2020-09-28 DIAGNOSIS — Z79899 Other long term (current) drug therapy: Secondary | ICD-10-CM | POA: Insufficient documentation

## 2020-09-28 HISTORY — DX: Essential (primary) hypertension: I10

## 2020-09-28 LAB — BASIC METABOLIC PANEL
Anion gap: 8 (ref 5–15)
BUN: 13 mg/dL (ref 6–20)
CO2: 25 mmol/L (ref 22–32)
Calcium: 8.9 mg/dL (ref 8.9–10.3)
Chloride: 105 mmol/L (ref 98–111)
Creatinine, Ser: 1.06 mg/dL (ref 0.61–1.24)
GFR, Estimated: >60 mL/min (ref >60)
Glucose, Bld: 115 mg/dL — ABNORMAL HIGH (ref 70–99)
Potassium: 3.6 mmol/L (ref 3.5–5.1)
Sodium: 138 mmol/L (ref 135–145)

## 2020-09-28 LAB — URINALYSIS, COMPLETE (UACMP) WITH MICROSCOPIC
Bacteria, UA: NONE SEEN
Bilirubin Urine: NEGATIVE
Glucose, UA: NEGATIVE mg/dL
Leukocytes,Ua: NEGATIVE
Nitrite: NEGATIVE
Protein, ur: NEGATIVE mg/dL
Specific Gravity, Urine: 1.025 (ref 1.005–1.030)
Squamous Epithelial / HPF: NONE SEEN (ref 0–5)
pH: 6 (ref 5.0–8.0)

## 2020-09-28 LAB — CBC
HCT: 43.9 % (ref 39.0–52.0)
Hemoglobin: 14.6 g/dL (ref 13.0–17.0)
MCH: 29.9 pg (ref 26.0–34.0)
MCHC: 33.3 g/dL (ref 30.0–36.0)
MCV: 90 fL (ref 80.0–100.0)
Platelets: 211 10*3/uL (ref 150–400)
RBC: 4.88 MIL/uL (ref 4.22–5.81)
RDW: 14.2 % (ref 11.5–15.5)
WBC: 7.7 10*3/uL (ref 4.0–10.5)
nRBC: 0 % (ref 0.0–0.2)

## 2020-09-28 NOTE — ED Provider Notes (Signed)
[facility] Emergency Department Provider Note ____________________________________________   Event Date/Time   First MD Initiated Contact with Patient 09/28/20 1422     (approximate)  I have reviewed the triage vital signs and the nursing notes.   HISTORY  Chief Complaint Numbness  HPI Jeremy Hodges is a 50 y.o. male with history of asthma, hypertension, palpitations and paroxysmal tachycardia presents to the emergency department for treatment and evaluation of transient numbness to the left hand, tongue, headache that was present for a few minutes earlier today. He denies numbness at this time. He was here yesterday due to hypertension and started on amlodipine. He has taken it today, but blood pressure is still high and he is concerned.         Past Medical History:  Diagnosis Date   Asthma    Hypertension     Patient Active Problem List   Diagnosis Date Noted   Palpitations 01/26/2017   Paroxysmal tachycardia (HCC) 01/26/2017   Smoker 01/26/2017    No past surgical history on file.  Prior to Admission medications   Medication Sig Start Date End Date Taking? Authorizing Provider  amLODipine (NORVASC) 5 MG tablet Take 1 tablet (5 mg total) by mouth daily. 09/27/20 09/27/21  Fisher, Roselyn Bering, PA-C    Allergies Iodine, Shrimp [shellfish allergy], and Aspirin  No family history on file.  Social History Social History   Tobacco Use   Smoking status: Every Day    Packs/day: 0.50    Types: Cigarettes   Smokeless tobacco: Never  Substance Use Topics   Alcohol use: Yes    Comment: occasionally   Drug use: No    Review of Systems  Constitutional: No fever/chills Eyes: No visual changes. ENT: No sore throat. Cardiovascular: Denies chest pain. Respiratory: Denies shortness of breath. Gastrointestinal: No abdominal pain.  No nausea, no vomiting.  No diarrhea.  No constipation. Genitourinary: Negative for dysuria. Musculoskeletal: Negative  for back pain. Skin: Negative for rash. Neurological: Negative for headaches, focal weakness or numbness. ____________________________________________   PHYSICAL EXAM:  VITAL SIGNS: ED Triage Vitals [09/28/20 1224]  Enc Vitals Group     BP 131/89     Pulse Rate 96     Resp 18     Temp 98.6 F (37 C)     Temp Source Oral     SpO2 97 %     Weight 194 lb (88 kg)     Height 5\' 6"  (1.676 m)     Head Circumference      Peak Flow      Pain Score 0     Pain Loc      Pain Edu?      Excl. in GC?     Constitutional: Alert and oriented. Well appearing and in no acute distress. Eyes: Conjunctivae are normal. PERRL. EOMI. Head: Atraumatic. Nose: No congestion/rhinnorhea. Mouth/Throat: Mucous membranes are moist.  Oropharynx non-erythematous. Neck: No stridor.   Hematological/Lymphatic/Immunilogical: No cervical lymphadenopathy. Cardiovascular: Normal rate, regular rhythm. Grossly normal heart sounds.  Good peripheral circulation. Respiratory: Normal respiratory effort.  No retractions. Lungs CTAB. Gastrointestinal: Soft and nontender. No distention. No abdominal bruits. No CVA tenderness. Genitourinary:  Musculoskeletal: No lower extremity tenderness nor edema.  No joint effusions. Neurologic:  Normal speech and language. No gross focal neurologic deficits are appreciated. No gait instability. Skin:  Skin is warm, dry and intact. No rash noted. Psychiatric: Mood and affect are normal. Speech and behavior are normal.  ____________________________________________  LABS (all labs ordered are listed, but only abnormal results are displayed)  Labs Reviewed  BASIC METABOLIC PANEL - Abnormal; Notable for the following components:      Result Value   Glucose, Bld 115 (*)    All other components within normal limits  URINALYSIS, COMPLETE (UACMP) WITH MICROSCOPIC - Abnormal; Notable for the following components:   Color, Urine YELLOW (*)    APPearance CLEAR (*)    Hgb urine dipstick  TRACE (*)    Ketones, ur TRACE (*)    All other components within normal limits  CBC   ____________________________________________  EKG  ED ECG REPORT I, Nevaeh Casillas, FNP-BC personally viewed and interpreted this ECG.   Date: 09/28/2020  EKG Time: 1229  Rate: 96  Rhythm: normal EKG, normal sinus rhythm  Axis: normal  Intervals:none  ST&T Change: no ST elevation  ____________________________________________  RADIOLOGY  ED MD interpretation:    Head CT without acute findings.  I, Kem Boroughs, personally viewed and evaluated these images (plain radiographs) as part of my medical decision making, as well as reviewing the written report by the radiologist.  Official radiology report(s): CT HEAD WO CONTRAST  Result Date: 09/28/2020 CLINICAL DATA:  Numbness in ring finger, tongue numbness, headache EXAM: CT HEAD WITHOUT CONTRAST TECHNIQUE: Contiguous axial images were obtained from the base of the skull through the vertex without intravenous contrast. COMPARISON:  None. FINDINGS: Brain: There is no acute intracranial hemorrhage, extra-axial fluid collection, or infarct. The ventricles are normal in size. There is no mass lesion. There is no midline shift. Vascular: No hyperdense vessel or unexpected calcification. Skull: Normal. Negative for fracture or focal lesion. Sinuses/Orbits: The imaged paranasal sinuses are clear. There is a dysconjugate gaze. The globes and orbits are otherwise unremarkable. Other: None. IMPRESSION: 1. No acute intracranial pathology. 2. Dysconjugate gaze. Electronically Signed   By: Lesia Hausen M.D.   On: 09/28/2020 14:11    ____________________________________________   PROCEDURES  Procedure(s) performed (including Critical Care):  Procedures  ____________________________________________   INITIAL IMPRESSION / ASSESSMENT AND PLAN     50 year old male presenting to the emergency department for treatment and evaluation of symptoms as described  in the HPI.  During this time that he had the numbness, he was still able to speak normally and did not have any weakness.  DIFFERENTIAL DIAGNOSIS  Hypertension, CVA, TIA  ED COURSE  Work up and exam reassuring. Patient requesting discharge. He is to follow up with primary care or return to the ER for concern.    ___________________________________________   FINAL CLINICAL IMPRESSION(S) / ED DIAGNOSES  Final diagnoses:  Hypertension, unspecified type     ED Discharge Orders     None        Jeremy Hodges was evaluated in Emergency Department on 09/28/2020 for the symptoms described in the history of present illness. He was evaluated in the context of the global COVID-19 pandemic, which necessitated consideration that the patient might be at risk for infection with the SARS-CoV-2 virus that causes COVID-19. Institutional protocols and algorithms that pertain to the evaluation of patients at risk for COVID-19 are in a state of rapid change based on information released by regulatory bodies including the CDC and federal and state organizations. These policies and algorithms were followed during the patient's care in the ED.   Note:  This document was prepared using Dragon voice recognition software and may include unintentional dictation errors.    Chinita Pester, FNP 09/28/20 2005  Sharman Cheek, MD 09/29/20 2337

## 2020-09-28 NOTE — ED Notes (Signed)
Pt resting quietly sitting upright on the stretcher looking at his phone, states that he is waiting to see the dr and is ready to go home, no distress noted cont to monitor

## 2020-09-28 NOTE — ED Triage Notes (Signed)
Pt here with numbness to his ring finger on the left hand that started on today. Pt states that his tongue began to get numb with a mild headache and traveled down to his finger. Pt also has HTN.

## 2020-09-28 NOTE — ED Notes (Signed)
Pt states he took his bp meds last night and this am. Pt states he had a HA earlier, but is feeling better now.

## 2020-11-24 ENCOUNTER — Emergency Department
Admission: EM | Admit: 2020-11-24 | Discharge: 2020-11-24 | Disposition: A | Discharge status: Home / Self Care | Payer: Self-pay | Attending: Student in an Organized Health Care Education/Training Program | Admitting: Student in an Organized Health Care Education/Training Program

## 2020-11-24 ENCOUNTER — Other Ambulatory Visit: Payer: Self-pay

## 2020-11-24 ENCOUNTER — Emergency Department: Payer: Self-pay

## 2020-11-24 ENCOUNTER — Encounter: Payer: Self-pay | Admitting: Emergency Medicine

## 2020-11-24 DIAGNOSIS — R002 Palpitations: Secondary | ICD-10-CM

## 2020-11-24 DIAGNOSIS — J45909 Unspecified asthma, uncomplicated: Secondary | ICD-10-CM | POA: Insufficient documentation

## 2020-11-24 DIAGNOSIS — F1090 Alcohol use, unspecified, uncomplicated: Secondary | ICD-10-CM

## 2020-11-24 DIAGNOSIS — Z789 Other specified health status: Secondary | ICD-10-CM

## 2020-11-24 DIAGNOSIS — I10 Essential (primary) hypertension: Secondary | ICD-10-CM | POA: Insufficient documentation

## 2020-11-24 DIAGNOSIS — F17210 Nicotine dependence, cigarettes, uncomplicated: Secondary | ICD-10-CM | POA: Insufficient documentation

## 2020-11-24 LAB — BASIC METABOLIC PANEL
Anion gap: 7 (ref 5–15)
BUN: 16 mg/dL (ref 6–20)
CO2: 28 mmol/L (ref 22–32)
Calcium: 9.3 mg/dL (ref 8.9–10.3)
Chloride: 107 mmol/L (ref 98–111)
Creatinine, Ser: 1.25 mg/dL — ABNORMAL HIGH (ref 0.61–1.24)
GFR, Estimated: >60 mL/min (ref >60)
Glucose, Bld: 74 mg/dL (ref 70–99)
Potassium: 4 mmol/L (ref 3.5–5.1)
Sodium: 142 mmol/L (ref 135–145)

## 2020-11-24 LAB — TROPONIN I (HIGH SENSITIVITY)
Troponin I (High Sensitivity): 5 ng/L (ref <18)
Troponin I (High Sensitivity): 5 ng/L (ref <18)

## 2020-11-24 LAB — CBC
HCT: 46.3 % (ref 39.0–52.0)
Hemoglobin: 14.8 g/dL (ref 13.0–17.0)
MCH: 29.6 pg (ref 26.0–34.0)
MCHC: 32 g/dL (ref 30.0–36.0)
MCV: 92.6 fL (ref 80.0–100.0)
Platelets: 211 10*3/uL (ref 150–400)
RBC: 5 MIL/uL (ref 4.22–5.81)
RDW: 14 % (ref 11.5–15.5)
WBC: 7 10*3/uL (ref 4.0–10.5)
nRBC: 0 % (ref 0.0–0.2)

## 2020-11-24 LAB — TSH: TSH: 0.613 u[IU]/mL (ref 0.350–4.500)

## 2020-11-24 NOTE — ED Provider Notes (Signed)
[facility] Emergency Department Provider Note    Event Date/Time   First MD Initiated Contact with Patient 11/24/20 1152     (approximate)  I have reviewed the triage vital signs and the nursing notes.   HISTORY  Chief Complaint Palpitations (/)    HPI Dayron Odland is a 50 y.o. male below listed past medical history presents to the ER for evaluation of palpitations.  States that they happen intermittently today lasted a few minutes.  No chest pain or pressure no diaphoresis.  Was recently diagnosed with hypertension was put on Norvasc.  Patient admits to drinking alcohol daily states he has [place] 5 shots per night.  No nausea or vomiting.  No tremulousness.   Past Medical History:  Diagnosis Date   Asthma    Hypertension    History reviewed. No pertinent family history. History reviewed. No pertinent surgical history. Patient Active Problem List   Diagnosis Date Noted   Palpitations 01/26/2017   Paroxysmal tachycardia (HCC) 01/26/2017   Smoker 01/26/2017      Prior to Admission medications   Medication Sig Start Date End Date Taking? Authorizing Provider  amLODipine (NORVASC) 5 MG tablet Take 1 tablet (5 mg total) by mouth daily. 09/27/20 09/27/21  Fisher, Roselyn Bering, PA-C    Allergies Iodine, Shrimp [shellfish allergy], and Aspirin    Social History Social History   Tobacco Use   Smoking status: Every Day    Packs/day: 0.50    Types: Cigarettes   Smokeless tobacco: Never  Substance Use Topics   Alcohol use: Yes    Comment: occasionally   Drug use: No    Review of Systems Patient denies headaches, rhinorrhea, blurry vision, numbness, shortness of breath, chest pain, edema, cough, abdominal pain, nausea, vomiting, diarrhea, dysuria, fevers, rashes or hallucinations unless otherwise stated above in HPI. ____________________________________________   PHYSICAL EXAM:  VITAL SIGNS: Vitals:   11/24/20 0939 11/24/20 1254  BP: (!)  156/116 (!) 152/101  Pulse: 83 76  Resp: 18 16  Temp: 98 F (36.7 C)   SpO2: 100% 100%    Constitutional: Alert and oriented.  Eyes: Conjunctivae are normal.  Head: Atraumatic. Nose: No congestion/rhinnorhea. Mouth/Throat: Mucous membranes are moist.   Neck: No stridor. Painless ROM.  Cardiovascular: Normal rate, regular rhythm. Grossly normal heart sounds.  Good peripheral circulation. Respiratory: Normal respiratory effort.  No retractions. Lungs CTAB. Gastrointestinal: Soft and nontender. No distention. No abdominal bruits. No CVA tenderness. Genitourinary:  Musculoskeletal: No lower extremity tenderness nor edema.  No joint effusions. Neurologic:  Normal speech and language. No gross focal neurologic deficits are appreciated. No facial droop Skin:  Skin is warm, dry and intact. No rash noted. Psychiatric: Mood and affect are normal. Speech and behavior are normal.  ____________________________________________   LABS (all labs ordered are listed, but only abnormal results are displayed)  Results for orders placed or performed during the hospital encounter of 11/24/20 (from the past 24 hour(s))  Basic metabolic panel     Status: Abnormal   Collection Time: 11/24/20  9:37 AM  Result Value Ref Range   Sodium 142 135 - 145 mmol/L   Potassium 4.0 3.5 - 5.1 mmol/L   Chloride 107 98 - 111 mmol/L   CO2 28 22 - 32 mmol/L   Glucose, Bld 74 70 - 99 mg/dL   BUN 16 6 - 20 mg/dL   Creatinine, Ser 3.33 (H) 0.61 - 1.24 mg/dL   Calcium 9.3 8.9 - 54.5 mg/dL  GFR, Estimated >60 >60 mL/min   Anion gap 7 5 - 15  CBC     Status: None   Collection Time: 11/24/20  9:37 AM  Result Value Ref Range   WBC 7.0 4.0 - 10.5 K/uL   RBC 5.00 4.22 - 5.81 MIL/uL   Hemoglobin 14.8 13.0 - 17.0 g/dL   HCT 16.1 09.6 - 04.5 %   MCV 92.6 80.0 - 100.0 fL   MCH 29.6 26.0 - 34.0 pg   MCHC 32.0 30.0 - 36.0 g/dL   RDW 40.9 81.1 - 91.4 %   Platelets 211 150 - 400 K/uL   nRBC 0.0 0.0 - 0.2 %  Troponin I  (High Sensitivity)     Status: None   Collection Time: 11/24/20  9:37 AM  Result Value Ref Range   Troponin I (High Sensitivity) 5 <18 ng/L  TSH     Status: None   Collection Time: 11/24/20  9:37 AM  Result Value Ref Range   TSH 0.613 0.350 - 4.500 uIU/mL  Troponin I (High Sensitivity)     Status: None   Collection Time: 11/24/20 12:39 PM  Result Value Ref Range   Troponin I (High Sensitivity) 5 <18 ng/L   ____________________________________________  EKG My review and personal interpretation at Time:   9:38 Indication: palpitations  Rate: 80  Rhythm: sinus Axis: normal Other: nonspecific st and t wave abn, no stemi, poor r wave progression ____________________________________________  RADIOLOGY  I personally reviewed all radiographic images ordered to evaluate for the above acute complaints and reviewed radiology reports and findings.  These findings were personally discussed with the patient.  Please see medical record for radiology report.  ____________________________________________   PROCEDURES  Procedure(s) performed:  Procedures    Critical Care performed: no ____________________________________________   INITIAL IMPRESSION / ASSESSMENT AND PLAN / ED COURSE  Pertinent labs & imaging results that were available during my care of the patient were reviewed by me and considered in my medical decision making (see chart for details).   DDX: dysrhythmia, hyperthyroid, electrolyte abn, chf, acs  Zaide Kardell is a 50 y.o. who presents to the ED with those described above.  Patient with some nonspecific ST and T wave changes but are consistent with previous EKGs.  He is not having any active symptoms at this time his troponins are negative.  Does not seem consistent with ACS not consistent with dissection or PE.  No respiratory symptoms.  Seems to be primarily palpitations and the patient admits to a very heavy alcohol use he is not showing signs of withdrawal but we  discussed that his alcohol use may be contributing to his symptoms and certainly a risk to his overall health.  He is declining referral to detox or anything at this point.  At this point he was stable and appropriate for discharge.  We discussed strict return precautions.     The patient was evaluated in Emergency Department today for the symptoms described in the history of present illness. He/she was evaluated in the context of the global COVID-19 pandemic, which necessitated consideration that the patient might be at risk for infection with the SARS-CoV-2 virus that causes COVID-19. Institutional protocols and algorithms that pertain to the evaluation of patients at risk for COVID-19 are in a state of rapid change based on information released by regulatory bodies including the CDC and federal and state organizations. These policies and algorithms were followed during the patient's care in the ED.  As part of  my medical decision making, I reviewed the following data within the electronic MEDICAL RECORD NUMBER Nursing notes reviewed and incorporated, Labs reviewed, notes from prior ED visits and [place] Controlled Substance Database   ____________________________________________   FINAL CLINICAL IMPRESSION(S) / ED DIAGNOSES  Final diagnoses:  Palpitations      NEW MEDICATIONS STARTED DURING THIS VISIT:  New Prescriptions   No medications on file     Note:  This document was prepared using Dragon voice recognition software and may include unintentional dictation errors.    Willy Eddy, MD 11/24/20 1353

## 2020-11-24 NOTE — ED Triage Notes (Signed)
Pt comes into the ED via POV c/o palpitations that started today while he was at work.  Pt denies any CP, SHOB, dizziness, or nausea.  Pt denies any cardiac history.  Pt does not present diaphoretic at this time and currently has even and unlabored respirations.

## 2020-12-07 ENCOUNTER — Emergency Department
Admission: EM | Admit: 2020-12-07 | Discharge: 2020-12-07 | Disposition: A | Discharge status: Home / Self Care | Payer: Self-pay | Attending: Emergency Medicine | Admitting: Emergency Medicine

## 2020-12-07 ENCOUNTER — Emergency Department: Payer: Self-pay

## 2020-12-07 ENCOUNTER — Other Ambulatory Visit: Payer: Self-pay

## 2020-12-07 DIAGNOSIS — J4521 Mild intermittent asthma with (acute) exacerbation: Secondary | ICD-10-CM | POA: Insufficient documentation

## 2020-12-07 DIAGNOSIS — J45909 Unspecified asthma, uncomplicated: Secondary | ICD-10-CM | POA: Insufficient documentation

## 2020-12-07 DIAGNOSIS — I10 Essential (primary) hypertension: Secondary | ICD-10-CM | POA: Insufficient documentation

## 2020-12-07 DIAGNOSIS — Z7951 Long term (current) use of inhaled steroids: Secondary | ICD-10-CM | POA: Diagnosis not present

## 2020-12-07 DIAGNOSIS — F17210 Nicotine dependence, cigarettes, uncomplicated: Secondary | ICD-10-CM | POA: Insufficient documentation

## 2020-12-07 DIAGNOSIS — R0602 Shortness of breath: Secondary | ICD-10-CM | POA: Diagnosis present

## 2020-12-07 DIAGNOSIS — Z79899 Other long term (current) drug therapy: Secondary | ICD-10-CM | POA: Insufficient documentation

## 2020-12-07 LAB — CBC
HCT: 45 % (ref 39.0–52.0)
Hemoglobin: 14.8 g/dL (ref 13.0–17.0)
MCH: 30.3 pg (ref 26.0–34.0)
MCHC: 32.9 g/dL (ref 30.0–36.0)
MCV: 92 fL (ref 80.0–100.0)
Platelets: 212 10*3/uL (ref 150–400)
RBC: 4.89 MIL/uL (ref 4.22–5.81)
RDW: 13.9 % (ref 11.5–15.5)
WBC: 6.5 10*3/uL (ref 4.0–10.5)
nRBC: 0 % (ref 0.0–0.2)

## 2020-12-07 LAB — BASIC METABOLIC PANEL
Anion gap: 9 (ref 5–15)
BUN: 15 mg/dL (ref 6–20)
CO2: 26 mmol/L (ref 22–32)
Calcium: 8.9 mg/dL (ref 8.9–10.3)
Chloride: 103 mmol/L (ref 98–111)
Creatinine, Ser: 1.01 mg/dL (ref 0.61–1.24)
GFR, Estimated: >60 mL/min (ref >60)
Glucose, Bld: 100 mg/dL — ABNORMAL HIGH (ref 70–99)
Potassium: 4.1 mmol/L (ref 3.5–5.1)
Sodium: 138 mmol/L (ref 135–145)

## 2020-12-07 MED ORDER — PREDNISONE 20 MG PO TABS: 60.0000 mg | ORAL_TABLET | Freq: Every day | ORAL | 0 refills | Status: AC

## 2020-12-07 MED ORDER — ALBUTEROL SULFATE HFA 108 (90 BASE) MCG/ACT IN AERS: INHALATION_SPRAY | RESPIRATORY_TRACT | 1 refills | Status: DC

## 2020-12-07 MED ORDER — IPRATROPIUM-ALBUTEROL 0.5-2.5 (3) MG/3ML IN SOLN
3.0000 mL | Freq: Once | RESPIRATORY_TRACT | Status: AC
  Administered 2020-12-07: 12:00:00 3 mL via RESPIRATORY_TRACT
  Filled 2020-12-07: qty 3

## 2020-12-07 MED ORDER — PREDNISONE 20 MG PO TABS
60.0000 mg | ORAL_TABLET | Freq: Once | ORAL | Status: AC
  Administered 2020-12-07: 12:00:00 60 mg via ORAL
  Filled 2020-12-07: qty 3

## 2020-12-07 NOTE — ED Triage Notes (Signed)
Pt here for SOB, pt was seen here for the same recently. Pt states he was driving to work and states that he had to pull over since he could not catch his breath. Pt also states no relief with inhaler use. Pt in NAD in triage.

## 2020-12-07 NOTE — ED Provider Notes (Signed)
[facility] Department Provider Note   ____________________________________________   Event Date/Time   First MD Initiated Contact with Patient 12/07/20 1114     (approximate)  I have reviewed the triage vital signs and the nursing notes.   HISTORY  Chief Complaint Shortness of Breath    HPI Jeremy Hodges is a 50 y.o. male history of asthma and hypertension  Patient reports for the last several months he has been experiencing intermittent shortness of breath and feels like he is wheezing at times.  He was driving to work today when he started to feel the same way as though he could not catch his breath.  He actually over the weekend went and got a Primatene inhaler, and use it and reports that actually will help slightly but does not seem to last  He is not having any chest pain denies any is really having trouble breathing per se but rather reports is more feeling he cannot catch his breath and he will be wheezing.  He has been using a humidifier in his home, and he also attributes that symptoms get better when he goes outside seem to get worse if he is inside the pets  No hives no itching.  No history of blood clots.  No leg swelling.  No long trips or travel.  He has been recently diagnosed with hypertension and is now taking Norvasc  Patient advised me he does not wish to change his dose at all, as he is concerned that different doses of his blood pressure medicine could interfere with erectile dysfunction   Past Medical History:  Diagnosis Date   Asthma    Hypertension     Patient Active Problem List   Diagnosis Date Noted   Palpitations 01/26/2017   Paroxysmal tachycardia (HCC) 01/26/2017   Smoker 01/26/2017    History reviewed. No pertinent surgical history.  Prior to Admission medications   Medication Sig Start Date End Date Taking? Authorizing Provider  albuterol (VENTOLIN HFA) 108 (90 Base) MCG/ACT inhaler Inhale 2-4 puffs  by mouth every 4 hours as needed for wheezing, cough, and/or shortness of breath 12/07/20  Yes Sharyn Creamer, MD  predniSONE (DELTASONE) 20 MG tablet Take 3 tablets (60 mg total) by mouth daily with breakfast for 4 days. 12/07/20 12/11/20 Yes Sharyn Creamer, MD  amLODipine (NORVASC) 5 MG tablet Take 1 tablet (5 mg total) by mouth daily. 09/27/20 09/27/21  Sherrie Mustache Roselyn Bering, PA-C    Allergies Iodine, Shrimp [shellfish allergy], and Aspirin  History reviewed. No pertinent family history.  Social History Social History   Tobacco Use   Smoking status: Every Day    Packs/day: 0.50    Types: Cigarettes   Smokeless tobacco: Never  Substance Use Topics   Alcohol use: Yes    Comment: occasionally   Drug use: No    Review of Systems Constitutional: No fever/chills Eyes: No visual changes. ENT: No sore throat. Cardiovascular: Denies chest pain. Respiratory: See HPI Gastrointestinal: No abdominal pain.   Musculoskeletal: Negative for back pain. Skin: Negative for rash. Neurological: Negative for weakness    ____________________________________________   PHYSICAL EXAM:  VITAL SIGNS: ED Triage Vitals  Enc Vitals Group     BP 12/07/20 0849 (!) 186/108     Pulse Rate 12/07/20 0849 82     Resp 12/07/20 0849 18     Temp 12/07/20 0849 97.7 F (36.5 C)     Temp Source 12/07/20 0849 Oral     SpO2 12/07/20  0849 94 %     Weight 12/07/20 0851 197 lb (89.4 kg)     Height 12/07/20 0851 5\' 6"  (1.676 m)     Head Circumference --      Peak Flow --      Pain Score 12/07/20 0850 0     Pain Loc --      Pain Edu? --      Excl. in GC? --     Constitutional: Alert and oriented. Well appearing and in no acute distress.  Very pleasant.  Fully alert. Eyes: Conjunctivae are normal. Head: Atraumatic. Nose: No congestion/rhinnorhea. Mouth/Throat: Mucous membranes are moist. Neck: No stridor.  Cardiovascular: Normal rate, regular rhythm. Grossly normal heart sounds.  Good peripheral  circulation. Respiratory: Normal respiratory effort.  No retractions. Lungs lungs clear except on end of expiration a mild expiratory wheezing is noted in all lung fields.  Very normal breathing pattern work of breathing. Gastrointestinal: Soft and nontender. No distention. Musculoskeletal: No lower extremity tenderness nor edema.  No calf tenderness.  No venous cords or congestion Neurologic:  Normal speech and language. No gross focal neurologic deficits are appreciated.  Skin:  Skin is warm, dry and intact. No rash noted. Psychiatric: Mood and affect are normal. Speech and behavior are normal.  ____________________________________________   LABS (all labs ordered are listed, but only abnormal results are displayed)  Labs Reviewed  BASIC METABOLIC PANEL - Abnormal; Notable for the following components:      Result Value   Glucose, Bld 100 (*)    All other components within normal limits  CBC   ____________________________________________  EKG  Compared with previous EKG from November 7  Interpreted EKG at 1135 Heart rate 75 QRS 80 QTc 430 Normal sinus rhythm, mild T wave abnormality including inversion in inferior leads.  Compared with his previous EKG there is no change.  Patient denies chest pain   ____________________________________________  RADIOLOGY  Chest x-ray personally viewed and interpreted by me, normal lungs  DG Chest 2 View  Result Date: 12/07/2020 CLINICAL DATA:  Shortness of breath. EXAM: CHEST - 2 VIEW COMPARISON:  11/24/2020. FINDINGS: The heart size and mediastinal contours are within normal limits. Both lungs are clear. The visualized skeletal structures are unremarkable. IMPRESSION: Normal chest. Electronically Signed   By: 13/04/2020 M.D.   On: 12/07/2020 09:26    ____________________________________________   PROCEDURES  Procedure(s) performed: None  Procedures  Critical Care performed:  No  ____________________________________________   INITIAL IMPRESSION / ASSESSMENT AND PLAN / ED COURSE  Pertinent labs & imaging results that were available during my care of the patient were reviewed by me and considered in my medical decision making (see chart for details).   Patient returns for dyspnea.  Wheezing noted.  Symptoms ongoing for some time relieved slightly by primary team history of asthma.  Is a current active smoker.  Counseled on smoking.  EKG demonstrates no changes from previous  No chest pain.  Recent work-up for palpitations.  He is currently on Norvasc.  Electrolytes and CBC reviewed and are normal.  He does not have any symptoms of ACS pulmonary embolism dissection pneumothorax or obvious evidence of acute infectious process.  I discussed with the patient I suspect either he is having mild asthma type exacerbation, or maybe he is developing some symptoms of possible bronchitis chronic bronchitis COPD.  Patient himself brings up some concern of possible COPD       After prednisone and nebulizer treatments patient asymptomatic.  Reports symptoms improved lungs are now clear without wheezing.  Discussed with the patient, will treat with prednisone and albuterol prescription and recommend follow-up with pulmonologist to establish primary care.  Patient agreeable with this plan he is awake alert well-appearing oxygen saturation normal signs have normalized with exception to mild hypertension at this time.  He appears.  Nontoxic and appropriate for discharge ____________________________________________   FINAL CLINICAL IMPRESSION(S) / ED DIAGNOSES  Final diagnoses:  Mild intermittent reactive airway disease with acute exacerbation        Note:  This document was prepared using Dragon voice recognition software and may include unintentional dictation errors       Sharyn Creamer, MD 12/07/20 1709

## 2020-12-25 ENCOUNTER — Emergency Department
Admission: EM | Admit: 2020-12-25 | Discharge: 2020-12-25 | Disposition: A | Discharge status: Home / Self Care | Payer: PRIVATE HEALTH INSURANCE | Attending: Emergency Medicine | Admitting: Emergency Medicine

## 2020-12-25 ENCOUNTER — Other Ambulatory Visit: Payer: Self-pay

## 2020-12-25 DIAGNOSIS — F17210 Nicotine dependence, cigarettes, uncomplicated: Secondary | ICD-10-CM | POA: Insufficient documentation

## 2020-12-25 DIAGNOSIS — I10 Essential (primary) hypertension: Secondary | ICD-10-CM | POA: Insufficient documentation

## 2020-12-25 DIAGNOSIS — Z20822 Contact with and (suspected) exposure to covid-19: Secondary | ICD-10-CM | POA: Insufficient documentation

## 2020-12-25 DIAGNOSIS — Z79899 Other long term (current) drug therapy: Secondary | ICD-10-CM | POA: Insufficient documentation

## 2020-12-25 DIAGNOSIS — J9801 Acute bronchospasm: Secondary | ICD-10-CM | POA: Diagnosis not present

## 2020-12-25 DIAGNOSIS — R0602 Shortness of breath: Secondary | ICD-10-CM | POA: Diagnosis present

## 2020-12-25 LAB — RESP PANEL BY RT-PCR (FLU A&B, COVID) ARPGX2
Influenza A by PCR: NEGATIVE
Influenza B by PCR: NEGATIVE
SARS Coronavirus 2 by RT PCR: NEGATIVE

## 2020-12-25 LAB — CBC
HCT: 44.7 % (ref 39.0–52.0)
Hemoglobin: 14.1 g/dL (ref 13.0–17.0)
MCH: 29.6 pg (ref 26.0–34.0)
MCHC: 31.5 g/dL (ref 30.0–36.0)
MCV: 93.9 fL (ref 80.0–100.0)
Platelets: 213 10*3/uL (ref 150–400)
RBC: 4.76 MIL/uL (ref 4.22–5.81)
RDW: 13.9 % (ref 11.5–15.5)
WBC: 7.3 10*3/uL (ref 4.0–10.5)
nRBC: 0 % (ref 0.0–0.2)

## 2020-12-25 LAB — COMPREHENSIVE METABOLIC PANEL
ALT: 23 U/L (ref 0–44)
AST: 28 U/L (ref 15–41)
Albumin: 4.3 g/dL (ref 3.5–5.0)
Alkaline Phosphatase: 52 U/L (ref 38–126)
Anion gap: 6 (ref 5–15)
BUN: 17 mg/dL (ref 6–20)
CO2: 27 mmol/L (ref 22–32)
Calcium: 9 mg/dL (ref 8.9–10.3)
Chloride: 104 mmol/L (ref 98–111)
Creatinine, Ser: 1.01 mg/dL (ref 0.61–1.24)
GFR, Estimated: >60 mL/min (ref >60)
Glucose, Bld: 106 mg/dL — ABNORMAL HIGH (ref 70–99)
Potassium: 3.8 mmol/L (ref 3.5–5.1)
Sodium: 137 mmol/L (ref 135–145)
Total Bilirubin: 1.1 mg/dL (ref 0.3–1.2)
Total Protein: 7.3 g/dL (ref 6.5–8.1)

## 2020-12-25 LAB — TROPONIN I (HIGH SENSITIVITY): Troponin I (High Sensitivity): 6 ng/L (ref <18)

## 2020-12-25 MED ORDER — PREDNISONE 50 MG PO TABS: 50.0000 mg | ORAL_TABLET | Freq: Every day | ORAL | 0 refills | Status: DC

## 2020-12-25 MED ORDER — ALBUTEROL SULFATE HFA 108 (90 BASE) MCG/ACT IN AERS: 2.0000 | INHALATION_SPRAY | Freq: Four times a day (QID) | RESPIRATORY_TRACT | 2 refills | Status: DC | PRN

## 2020-12-25 NOTE — ED Provider Notes (Signed)
[facility] Emergency Department Provider Note   ____________________________________________    I have reviewed the triage vital signs and the nursing notes.   HISTORY  Chief Complaint Shortness of Breath     HPI Jeremy Hodges is a 50 y.o. male with history of asthma who presents with complaints of shortness of breath.  Patient does admit to smoking cigarettes.  He reports that he feels a tightness in his chest and some difficulty breathing.  Denies fevers or chills, occasional cough.  No calf pain or lower leg swelling  Past Medical History:  Diagnosis Date   Asthma    Hypertension     Patient Active Problem List   Diagnosis Date Noted   Palpitations 01/26/2017   Paroxysmal tachycardia (HCC) 01/26/2017   Smoker 01/26/2017    No past surgical history on file.  Prior to Admission medications   Medication Sig Start Date End Date Taking? Authorizing Provider  albuterol (VENTOLIN HFA) 108 (90 Base) MCG/ACT inhaler Inhale 2 puffs into the lungs every 6 (six) hours as needed for wheezing or shortness of breath. 12/25/20  Yes Jene Every, MD  predniSONE (DELTASONE) 50 MG tablet Take 1 tablet (50 mg total) by mouth daily with breakfast. 12/25/20  Yes Jene Every, MD  amLODipine (NORVASC) 5 MG tablet Take 1 tablet (5 mg total) by mouth daily. 09/27/20 09/27/21  Fisher, Roselyn Bering, PA-C     Allergies Iodine, Shrimp [shellfish allergy], and Aspirin  No family history on file.  Social History Social History   Tobacco Use   Smoking status: Every Day    Packs/day: 0.50    Types: Cigarettes   Smokeless tobacco: Never  Substance Use Topics   Alcohol use: Yes    Comment: occasionally   Drug use: No    Review of Systems  Constitutional: No fever/chills  ENT: No sore throat.  Respiratory: As above Gastrointestinal: No abdominal pain.  No nausea, no vomiting.   Genitourinary: Negative for dysuria. Musculoskeletal: Negative for back  pain. Skin: Negative for rash. Neurological: Negative for headaches     ____________________________________________   PHYSICAL EXAM:  VITAL SIGNS: ED Triage Vitals  Enc Vitals Group     BP 12/25/20 0758 (!) 164/94     Pulse Rate 12/25/20 0758 85     Resp 12/25/20 0758 20     Temp 12/25/20 0758 97.7 F (36.5 C)     Temp Source 12/25/20 0758 Oral     SpO2 12/25/20 0758 97 %     Weight 12/25/20 0759 89.4 kg (197 lb)     Height 12/25/20 0759 1.676 m (5\' 6" )     Head Circumference --      Peak Flow --      Pain Score 12/25/20 0758 0     Pain Loc --      Pain Edu? --      Excl. in [place]? --      Constitutional: Alert and oriented.  Eyes: Conjunctivae are normal.   Nose: No congestion/rhinnorhea. Mouth/Throat: Mucous membranes are moist.   Cardiovascular: Normal rate, regular rhythm.  Respiratory: Normal respiratory effort.  No retractions.  Scattered mild wheezes  Musculoskeletal: No lower extremity tenderness nor edema.   Neurologic:  Normal speech and language. No gross focal neurologic deficits are appreciated.   Skin:  Skin is warm, dry and intact. No rash noted.   ____________________________________________   LABS (all labs ordered are listed, but only abnormal results are displayed)  Labs Reviewed  COMPREHENSIVE METABOLIC PANEL - Abnormal; Notable for the following components:      Result Value   Glucose, Bld 106 (*)    All other components within normal limits  RESP PANEL BY RT-PCR (FLU A&B, COVID) ARPGX2  CBC  TROPONIN I (HIGH SENSITIVITY)   ____________________________________________  EKG  ED ECG REPORT I, Jene Every, the attending physician, personally viewed and interpreted this ECG.  Date: 12/25/2020  Rhythm: normal sinus rhythm QRS Axis: normal Intervals: normal ST/T Wave abnormalities: normal Narrative Interpretation: no evidence of acute  ischemia  ____________________________________________  RADIOLOGY  ____________________________________________   PROCEDURES  Procedure(s) performed: No  Procedures   Critical Care performed: No ____________________________________________   INITIAL IMPRESSION / ASSESSMENT AND PLAN / ED COURSE  Pertinent labs & imaging results that were available during my care of the patient were reviewed by me and considered in my medical decision making (see chart for details).   Patient well-appearing and in no acute distress, lung exam and straits very mild scattered wheezes, no significantly increased work of breathing  Lab work is reassuring, normal troponin, normal white blood cell count, normal CMP  COVID and flu PCR negative  We will treat with prednisone and albuterol, outpatient follow-up with PCP   ____________________________________________   FINAL CLINICAL IMPRESSION(S) / ED DIAGNOSES  Final diagnoses:  Bronchospasm      NEW MEDICATIONS STARTED DURING THIS VISIT:  Discharge Medication List as of 12/25/2020 11:42 AM     START taking these medications   Details  predniSONE (DELTASONE) 50 MG tablet Take 1 tablet (50 mg total) by mouth daily with breakfast., Starting Mon 12/25/2020, Normal         Note:  This document was prepared using Dragon voice recognition software and may include unintentional dictation errors.    Jene Every, MD 12/25/20 1535

## 2020-12-25 NOTE — ED Triage Notes (Signed)
Pt in with co shob for 2 weeks was seen here for the same and was given steroids. Pt here for persistent shob, states has been using inhaler at home.

## 2021-01-30 ENCOUNTER — Emergency Department
Admission: EM | Admit: 2021-01-30 | Discharge: 2021-01-30 | Disposition: A | Discharge status: Home / Self Care | Payer: PRIVATE HEALTH INSURANCE | Attending: Emergency Medicine | Admitting: Emergency Medicine

## 2021-01-30 ENCOUNTER — Emergency Department: Payer: PRIVATE HEALTH INSURANCE

## 2021-01-30 ENCOUNTER — Other Ambulatory Visit: Payer: Self-pay

## 2021-01-30 DIAGNOSIS — W130XXA Fall from, out of or through balcony, initial encounter: Secondary | ICD-10-CM | POA: Insufficient documentation

## 2021-01-30 DIAGNOSIS — Y9301 Activity, walking, marching and hiking: Secondary | ICD-10-CM | POA: Insufficient documentation

## 2021-01-30 DIAGNOSIS — S93601A Unspecified sprain of right foot, initial encounter: Secondary | ICD-10-CM | POA: Insufficient documentation

## 2021-01-30 MED ORDER — TRAMADOL HCL 50 MG PO TABS
50.0000 mg | ORAL_TABLET | Freq: Four times a day (QID) | ORAL | 0 refills | Status: DC | PRN
Start: 2021-01-30 — End: 2021-01-30

## 2021-01-30 MED ORDER — TRAMADOL HCL 50 MG PO TABS: 50.0000 mg | ORAL_TABLET | Freq: Four times a day (QID) | ORAL | 0 refills | Status: AC | PRN

## 2021-01-30 MED ORDER — TRAMADOL HCL 50 MG PO TABS
50.0000 mg | ORAL_TABLET | Freq: Once | ORAL | Status: AC
  Administered 2021-01-30: 50 mg via ORAL
  Filled 2021-01-30: qty 1

## 2021-01-30 NOTE — ED Triage Notes (Signed)
Pt states he stepped off the porch wrong last night and injured his right great toe and 2nd, 3rd, states the big toe hurts the worst

## 2021-01-30 NOTE — ED Provider Notes (Signed)
° °  [facility] Provider Note    Event Date/Time   First MD Initiated Contact with Patient 01/30/21 1002     (approximate)   History   Toe Pain   HPI  Jeremy Hodges is a 51 y.o. male who presents with complaints of right foot and toe pain.  Patient reports he missed stepped yesterday while walking on the porch.  This morning he has significant pain underneath his great toe on the right, mild swelling reported.  Has not take anything for this      Physical Exam   Triage Vital Signs: ED Triage Vitals [01/30/21 1001]  Enc Vitals Group     BP (!) 173/106     Pulse Rate (!) 108     Resp 20     Temp 97.6 F (36.4 C)     Temp Source Oral     SpO2 96 %     Weight      Height      Head Circumference      Peak Flow      Pain Score      Pain Loc      Pain Edu?      Excl. in GC?     Most recent vital signs: Vitals:   01/30/21 1001  BP: (!) 173/106  Pulse: (!) 108  Resp: 20  Temp: 97.6 F (36.4 C)  SpO2: 96%     General: Awake, no distress.  CV:  Good peripheral perfusion.  Resp:  Normal effort.  Abd:  No distention.  Other:  Musculoskeletal: Right foot.  Toes 2 through 5 are normal, great toe mild discomfort with range of motion.  Tenderness on the plantar surface at the base of the great toe which is moderate.  No bony abnormalities palpated   ED Results / Procedures / Treatments   Labs (all labs ordered are listed, but only abnormal results are displayed) Labs Reviewed - No data to display   EKG     RADIOLOGY Foot x-ray reviewed by me, no acute abnormality, pending radiology review    PROCEDURES:  Critical Care performed:   Procedures   MEDICATIONS ORDERED IN ED: Medications  traMADol (ULTRAM) tablet 50 mg (50 mg Oral Given 01/30/21 1011)     IMPRESSION / MDM / ASSESSMENT AND PLAN / ED COURSE  I reviewed the triage vital signs and the nursing notes.  Patient presents after misstep with injury to the right great  toe, differential includes fracture versus sprain  Will give p.o. tramadol, pending x-ray results  X-ray without evidence of fracture, most consistent with sprain, recommend supportive care, ice, will provide tramadol prescription, outpatient follow-up with orthopedics/podiatry as needed if no improvement          FINAL CLINICAL IMPRESSION(S) / ED DIAGNOSES   Final diagnoses:  Foot sprain, right, initial encounter     Rx / DC Orders   ED Discharge Orders          Ordered    traMADol (ULTRAM) 50 MG tablet  Every 6 hours PRN,   Status:  Discontinued        01/30/21 1042    traMADol (ULTRAM) 50 MG tablet  Every 6 hours PRN        01/30/21 1054             Note:  This document was prepared using Dragon voice recognition software and may include unintentional dictation errors.   Jene Every, MD 01/30/21 1209

## 2021-01-30 NOTE — ED Notes (Signed)
See triage note  presents with pain to right foot  states he stepped off porch wrong  having pain across foot  mainly to right great toe

## 2021-02-02 ENCOUNTER — Other Ambulatory Visit: Payer: Self-pay

## 2021-02-02 ENCOUNTER — Emergency Department
Admission: EM | Admit: 2021-02-02 | Discharge: 2021-02-02 | Disposition: A | Discharge status: Home / Self Care | Payer: Self-pay | Attending: Emergency Medicine | Admitting: Emergency Medicine

## 2021-02-02 DIAGNOSIS — Z79899 Other long term (current) drug therapy: Secondary | ICD-10-CM | POA: Insufficient documentation

## 2021-02-02 DIAGNOSIS — F17210 Nicotine dependence, cigarettes, uncomplicated: Secondary | ICD-10-CM | POA: Insufficient documentation

## 2021-02-02 DIAGNOSIS — X58XXXD Exposure to other specified factors, subsequent encounter: Secondary | ICD-10-CM | POA: Insufficient documentation

## 2021-02-02 DIAGNOSIS — I10 Essential (primary) hypertension: Secondary | ICD-10-CM | POA: Insufficient documentation

## 2021-02-02 DIAGNOSIS — J45909 Unspecified asthma, uncomplicated: Secondary | ICD-10-CM | POA: Insufficient documentation

## 2021-02-02 DIAGNOSIS — Z7952 Long term (current) use of systemic steroids: Secondary | ICD-10-CM | POA: Insufficient documentation

## 2021-02-02 DIAGNOSIS — S93501D Unspecified sprain of right great toe, subsequent encounter: Secondary | ICD-10-CM | POA: Insufficient documentation

## 2021-02-02 NOTE — Discharge Instructions (Signed)
Wear open shoes for 2 to 3 days.  Continue previous medications.

## 2021-02-02 NOTE — ED Provider Notes (Signed)
[facility] Emergency Department Provider Note   ____________________________________________   Event Date/Time   First MD Initiated Contact with Patient 02/02/21 [id number]     (approximate)  I have reviewed the triage vital signs and the nursing notes.   HISTORY  Chief Complaint Letter for School/Work    HPI Jeremy Hodges is a 51 y.o. male patient follow-up for sprain foot was seen in this clinic department 3 days ago.  Patient states continue to have pain with ambulation.  States pain medication causes drowsiness results in inability to drive or operate machinery.         Past Medical History:  Diagnosis Date   Asthma    Hypertension     Patient Active Problem List   Diagnosis Date Noted   Palpitations 01/26/2017   Paroxysmal tachycardia (HCC) 01/26/2017   Smoker 01/26/2017    History reviewed. No pertinent surgical history.  Prior to Admission medications   Medication Sig Start Date End Date Taking? Authorizing Provider  albuterol (VENTOLIN HFA) 108 (90 Base) MCG/ACT inhaler Inhale 2 puffs into the lungs every 6 (six) hours as needed for wheezing or shortness of breath. 12/25/20   Jene Every, MD  amLODipine (NORVASC) 5 MG tablet Take 1 tablet (5 mg total) by mouth daily. 09/27/20 09/27/21  Fisher, Roselyn Bering, PA-C  predniSONE (DELTASONE) 50 MG tablet Take 1 tablet (50 mg total) by mouth daily with breakfast. 12/25/20   Jene Every, MD  traMADol (ULTRAM) 50 MG tablet Take 1 tablet (50 mg total) by mouth every 6 (six) hours as needed. 01/30/21 01/30/22  Faythe Ghee, PA-C    Allergies Iodine, Shrimp [shellfish allergy], and Aspirin  No family history on file.  Social History Social History   Tobacco Use   Smoking status: Every Day    Packs/day: 0.50    Types: Cigarettes   Smokeless tobacco: Never  Substance Use Topics   Alcohol use: Yes    Comment: occasionally   Drug use: No    Review of Systems Constitutional: No  fever/chills Eyes: No visual changes. ENT: No sore throat. Cardiovascular: Denies chest pain. Respiratory: Denies shortness of breath. Gastrointestinal: No abdominal pain.  No nausea, no vomiting.  No diarrhea.  No constipation. Genitourinary: Negative for dysuria. Musculoskeletal: Right foot and great toe pain.. Skin: Negative for rash. Neurological: Negative for headaches, focal weakness or numbness. Allergic/Immunilogical: Lodine, shrimp, and aspirin. ____________________________________________   PHYSICAL EXAM:  VITAL SIGNS: ED Triage Vitals  Enc Vitals Group     BP 02/02/21 0916 (!) 177/108     Pulse Rate 02/02/21 0916 83     Resp 02/02/21 0916 16     Temp 02/02/21 0916 98.2 F (36.8 C)     Temp Source 02/02/21 0916 Oral     SpO2 02/02/21 0916 96 %     Weight 02/02/21 0926 197 lb 1.5 oz (89.4 kg)     Height 02/02/21 0926 5\' 6"  (1.676 m)     Head Circumference --      Peak Flow --      Pain Score --      Pain Loc --      Pain Edu? --      Excl. in GC? --     Constitutional: Alert and oriented. Well appearing and in no acute distress. Cardiovascular: Normal rate, regular rhythm. Grossly normal heart sounds.  Good peripheral circulation.  Evaded blood pressure. Respiratory: Normal respiratory effort.  No retractions. Lungs CTAB. Genitourinary: Deferred Musculoskeletal: No  gross deformity of the right foot/great toe.  Moderate edema.  No erythema.   Neurologic:  Normal speech and language. No gross focal neurologic deficits are appreciated. No gait instability. Skin:  Skin is warm, dry and intact. No rash noted. Psychiatric: Mood and affect are normal. Speech and behavior are normal.  ____________________________________________   LABS (all labs ordered are listed, but only abnormal results are displayed)  Labs Reviewed - No data to display ____________________________________________  EKG   ____________________________________________  RADIOLOGY I, Joni Reining, personally viewed and evaluated these images (plain radiographs) as part of my medical decision making, as well as reviewing the written report by the radiologist.  ED MD interpretation: Reviewed x-ray from previous visit showed no acute findings.  Official radiology report(s): No results found.  ____________________________________________   PROCEDURES  Procedure(s) performed (including Critical Care):  Procedures   ____________________________________________   INITIAL IMPRESSION / ASSESSMENT AND PLAN / ED COURSE  As part of my medical decision making, I reviewed the following data within the electronic MEDICAL RECORD NUMBER         Continued pain secondary to sprain of the right foot.  Patient placed in open shoes given discharge care instruction.  Patient given a work note and advised on the drowsy effects of medication.     ____________________________________________   FINAL CLINICAL IMPRESSION(S) / ED DIAGNOSES  Final diagnoses:  Sprain of right great toe, subsequent encounter     ED Discharge Orders     None        Note:  This document was prepared using Dragon voice recognition software and may include unintentional dictation errors.    Joni Reining, PA-C 02/02/21 2440    Dionne Bucy, MD 02/02/21 1335

## 2021-02-02 NOTE — ED Triage Notes (Signed)
Pt states he was seen her 01/30/2021 for toe injury and was suppose to go back to work today but is still having pain.

## 2021-02-02 NOTE — ED Notes (Signed)
See triage note  states he is still having pain to toe  states he is not able to work d/t pain

## 2021-02-06 ENCOUNTER — Encounter: Payer: Self-pay | Admitting: Emergency Medicine

## 2021-02-06 ENCOUNTER — Emergency Department
Admission: EM | Admit: 2021-02-06 | Discharge: 2021-02-06 | Disposition: A | Discharge status: Home / Self Care | Payer: PRIVATE HEALTH INSURANCE | Attending: Emergency Medicine | Admitting: Emergency Medicine

## 2021-02-06 ENCOUNTER — Other Ambulatory Visit: Payer: Self-pay

## 2021-02-06 DIAGNOSIS — S93601D Unspecified sprain of right foot, subsequent encounter: Secondary | ICD-10-CM

## 2021-02-06 DIAGNOSIS — M79671 Pain in right foot: Secondary | ICD-10-CM | POA: Insufficient documentation

## 2021-02-06 DIAGNOSIS — X58XXXD Exposure to other specified factors, subsequent encounter: Secondary | ICD-10-CM | POA: Insufficient documentation

## 2021-02-06 NOTE — ED Triage Notes (Signed)
Pt here with tight foot pain. Pt was seen here recently for same. Pt in NAD in triage.

## 2021-02-06 NOTE — ED Provider Notes (Signed)
° °  [facility] Provider Note    Event Date/Time   First MD Initiated Contact with Patient 02/06/21 1125     (approximate)   History   Foot Pain   HPI  Jeremy Hodges is a 51 y.o. male presents to the emergency department for treatment and evaluation of right foot pain, He has been here twice before for the same. States that he is unable to wear the post-op shoe that he was given last time while at work. When wearing the shoe and elevating it, the pain is much better but his work note expired and has had to try and go back. He is still unable to walk and stand long periods due to pain. No new injury.      Physical Exam   Triage Vital Signs: ED Triage Vitals [02/06/21 1120]  Enc Vitals Group     BP (!) 154/99     Pulse Rate 85     Resp 18     Temp 97.8 F (36.6 C)     Temp Source Oral     SpO2 99 %     Weight 197 lb 1.5 oz (89.4 kg)     Height 5\' 6"  (1.676 m)     Head Circumference      Peak Flow      Pain Score 9     Pain Loc      Pain Edu?      Excl. in [place]?     Most recent vital signs: Vitals:   02/06/21 1120  BP: (!) 154/99  Pulse: 85  Resp: 18  Temp: 97.8 F (36.6 C)  SpO2: 99%   General: Awake, no distress.  CV:  Good peripheral perfusion.  Resp:  Normal effort.  Abd:  No distention.  Other:  Right great toe tenderness at MCP. No open wound/swelling.   ED Results / Procedures / Treatments   Labs (all labs ordered are listed, but only abnormal results are displayed) Labs Reviewed - No data to display   EKG  Not indicated.   RADIOLOGY  Not indicated.  PROCEDURES:  Critical Care performed: No  Procedures   MEDICATIONS ORDERED IN ED: Medications - No data to display   IMPRESSION / MDM / [place] / ED COURSE  I reviewed the triage vital signs and the nursing notes.  Differential diagnosis includes, but is not limited to, right foot strain/fracture.  Patient advised to rest, ice, and elevate foot.  He is also to wear the post-op shoe as often as possible. Work note x 3 days provided. He is aware that he needs to follow up with podiatry for additional workup. He will take tylenol or ibuprofen for pain. Dosing discussed.       FINAL CLINICAL IMPRESSION(S) / ED DIAGNOSES   Final diagnoses:  Foot sprain, right, subsequent encounter     Rx / DC Orders   ED Discharge Orders     None        Note:  This document was prepared using Dragon voice recognition software and may include unintentional dictation errors.   Victorino Dike, FNP 02/06/21 1544    Duffy Bruce, MD 02/08/21 1943

## 2021-02-06 NOTE — ED Triage Notes (Signed)
Pt reports that he was here last week for the right foot pain, he is still having the pain. He was placed in a boot last week, but did not have it on when coming in. He was able to bear weight and ambulate with slight limp

## 2021-02-06 NOTE — Discharge Instructions (Signed)
Rotate tylenol and ibuprofen. Follow up with podiatry.

## 2021-02-06 NOTE — ED Notes (Addendum)
See triage note  presents with cont'd pain to right foot pain states pain is mainly to right great toe Had open toed shoe placed   denies any new injury

## 2021-03-19 ENCOUNTER — Emergency Department
Admission: EM | Admit: 2021-03-19 | Discharge: 2021-03-19 | Disposition: A | Discharge status: Home / Self Care | Payer: Self-pay | Attending: Emergency Medicine | Admitting: Emergency Medicine

## 2021-03-19 ENCOUNTER — Other Ambulatory Visit: Payer: Self-pay

## 2021-03-19 DIAGNOSIS — K649 Unspecified hemorrhoids: Secondary | ICD-10-CM | POA: Diagnosis not present

## 2021-03-19 DIAGNOSIS — K921 Melena: Secondary | ICD-10-CM | POA: Diagnosis present

## 2021-03-19 LAB — URINALYSIS, ROUTINE W REFLEX MICROSCOPIC
Bilirubin Urine: NEGATIVE
Glucose, UA: NEGATIVE mg/dL
Ketones, ur: NEGATIVE mg/dL
Leukocytes,Ua: NEGATIVE
Nitrite: NEGATIVE
Protein, ur: NEGATIVE mg/dL
Specific Gravity, Urine: 1.014 (ref 1.005–1.030)
Squamous Epithelial / HPF: NONE SEEN (ref 0–5)
pH: 5 (ref 5.0–8.0)

## 2021-03-19 LAB — COMPREHENSIVE METABOLIC PANEL
ALT: 18 U/L (ref 0–44)
AST: 26 U/L (ref 15–41)
Albumin: 4 g/dL (ref 3.5–5.0)
Alkaline Phosphatase: 54 U/L (ref 38–126)
Anion gap: 8 (ref 5–15)
BUN: 20 mg/dL (ref 6–20)
CO2: 24 mmol/L (ref 22–32)
Calcium: 9.1 mg/dL (ref 8.9–10.3)
Chloride: 104 mmol/L (ref 98–111)
Creatinine, Ser: 1.1 mg/dL (ref 0.61–1.24)
GFR, Estimated: >60 mL/min (ref >60)
Glucose, Bld: 85 mg/dL (ref 70–99)
Potassium: 4.1 mmol/L (ref 3.5–5.1)
Sodium: 136 mmol/L (ref 135–145)
Total Bilirubin: 0.2 mg/dL — ABNORMAL LOW (ref 0.3–1.2)
Total Protein: 7.1 g/dL (ref 6.5–8.1)

## 2021-03-19 LAB — CBC
HCT: 44.3 % (ref 39.0–52.0)
Hemoglobin: 14.1 g/dL (ref 13.0–17.0)
MCH: 29.1 pg (ref 26.0–34.0)
MCHC: 31.8 g/dL (ref 30.0–36.0)
MCV: 91.5 fL (ref 80.0–100.0)
Platelets: 199 10*3/uL (ref 150–400)
RBC: 4.84 MIL/uL (ref 4.22–5.81)
RDW: 13.3 % (ref 11.5–15.5)
WBC: 8 10*3/uL (ref 4.0–10.5)
nRBC: 0 % (ref 0.0–0.2)

## 2021-03-19 LAB — LIPASE, BLOOD: Lipase: 42 U/L (ref 11–51)

## 2021-03-19 NOTE — ED Provider Notes (Signed)
[facility] Provider Note    Event Date/Time   First MD Initiated Contact with Patient 03/19/21 1051     (approximate)   History   Blood In Stools   HPI  Jeremy Hodges is a 51 y.o. male with no significant past medical history who presents with complaints of bleeding after having a bowel movement today.  Patient noticed this this morning.  Small mount of bright red blood on his toilet paper after having bowel movement.  He also notes 1 or 2 drips into the toilet.  Denies pain.  Not on blood thinner     Physical Exam   Triage Vital Signs: ED Triage Vitals  Enc Vitals Group     BP 03/19/21 1022 (!) 135/111     Pulse Rate 03/19/21 1022 88     Resp 03/19/21 1022 16     Temp 03/19/21 1022 98 F (36.7 C)     Temp Source 03/19/21 1022 Oral     SpO2 03/19/21 1022 98 %     Weight 03/19/21 1023 89 kg (196 lb 3.4 oz)     Height 03/19/21 1023 1.676 m (5\' 6" )     Head Circumference --      Peak Flow --      Pain Score 03/19/21 1023 0     Pain Loc --      Pain Edu? --      Excl. in GC? --     Most recent vital signs: Vitals:   03/19/21 1022  BP: (!) 135/111  Pulse: 88  Resp: 16  Temp: 98 F (36.7 C)  SpO2: 98%     General: Awake, no distress.  CV:  Good peripheral perfusion.  Resp:  Normal effort.  Abd:  No distention.  Visual anal exam, no acute abnormality Other:    ED Results / Procedures / Treatments   Labs (all labs ordered are listed, but only abnormal results are displayed) Labs Reviewed  COMPREHENSIVE METABOLIC PANEL - Abnormal; Notable for the following components:      Result Value   Total Bilirubin 0.2 (*)    All other components within normal limits  URINALYSIS, ROUTINE W REFLEX MICROSCOPIC - Abnormal; Notable for the following components:   Color, Urine STRAW (*)    APPearance CLEAR (*)    Hgb urine dipstick SMALL (*)    Bacteria, UA RARE (*)    All other components within normal limits  LIPASE, BLOOD  CBC      EKG     RADIOLOGY     PROCEDURES:  Critical Care performed:   Procedures   MEDICATIONS ORDERED IN ED: Medications - No data to display   IMPRESSION / MDM / ASSESSMENT AND PLAN / ED COURSE  I reviewed the triage vital signs and the nursing notes.    Patient presents with symptoms as above, highly suspicious for hemorrhoidal bleeding.  No abdominal pain or rectal pain  On exam no acute abnormality noted, suspect internal hemorrhoids  If continued bleeding recommend follow-up with GI  Lab work reviewed here is normal, normal CMP, normal hemoglobin        FINAL CLINICAL IMPRESSION(S) / ED DIAGNOSES   Final diagnoses:  Hemorrhoids, unspecified hemorrhoid type     Rx / DC Orders   ED Discharge Orders     None        Note:  This document was prepared using Dragon voice recognition software and may include unintentional dictation errors.  Jene Every, MD 03/19/21 1626

## 2021-03-19 NOTE — ED Triage Notes (Signed)
Reports at work, had BM and when he wiped had small amount of bright red blood in stool. No pain in rectum or abdomen, does not use blood thinners. Denies constipation.

## 2021-04-06 ENCOUNTER — Emergency Department
Admission: EM | Admit: 2021-04-06 | Discharge: 2021-04-06 | Disposition: A | Discharge status: Home / Self Care | Payer: PRIVATE HEALTH INSURANCE | Attending: Emergency Medicine | Admitting: Emergency Medicine

## 2021-04-06 ENCOUNTER — Other Ambulatory Visit: Payer: Self-pay

## 2021-04-06 ENCOUNTER — Emergency Department: Payer: PRIVATE HEALTH INSURANCE

## 2021-04-06 DIAGNOSIS — J9801 Acute bronchospasm: Secondary | ICD-10-CM | POA: Insufficient documentation

## 2021-04-06 LAB — CBC WITH DIFFERENTIAL/PLATELET
Abs Immature Granulocytes: 0.02 10*3/uL (ref 0.00–0.07)
Basophils Absolute: 0 10*3/uL (ref 0.0–0.1)
Basophils Relative: 0 %
Eosinophils Absolute: 0.1 10*3/uL (ref 0.0–0.5)
Eosinophils Relative: 1 %
HCT: 43.4 % (ref 39.0–52.0)
Hemoglobin: 13.8 g/dL (ref 13.0–17.0)
Immature Granulocytes: 0 %
Lymphocytes Relative: 27 %
Lymphs Abs: 1.9 10*3/uL (ref 0.7–4.0)
MCH: 29.4 pg (ref 26.0–34.0)
MCHC: 31.8 g/dL (ref 30.0–36.0)
MCV: 92.3 fL (ref 80.0–100.0)
Monocytes Absolute: 0.5 10*3/uL (ref 0.1–1.0)
Monocytes Relative: 7 %
Neutro Abs: 4.7 10*3/uL (ref 1.7–7.7)
Neutrophils Relative %: 65 %
Platelets: 194 10*3/uL (ref 150–400)
RBC: 4.7 MIL/uL (ref 4.22–5.81)
RDW: 13.5 % (ref 11.5–15.5)
WBC: 7.2 10*3/uL (ref 4.0–10.5)
nRBC: 0 % (ref 0.0–0.2)

## 2021-04-06 LAB — COMPREHENSIVE METABOLIC PANEL
ALT: 20 U/L (ref 0–44)
AST: 29 U/L (ref 15–41)
Albumin: 4 g/dL (ref 3.5–5.0)
Alkaline Phosphatase: 44 U/L (ref 38–126)
Anion gap: 6 (ref 5–15)
BUN: 20 mg/dL (ref 6–20)
CO2: 25 mmol/L (ref 22–32)
Calcium: 8.9 mg/dL (ref 8.9–10.3)
Chloride: 106 mmol/L (ref 98–111)
Creatinine, Ser: 1.21 mg/dL (ref 0.61–1.24)
GFR, Estimated: >60 mL/min (ref >60)
Glucose, Bld: 91 mg/dL (ref 70–99)
Potassium: 4.4 mmol/L (ref 3.5–5.1)
Sodium: 137 mmol/L (ref 135–145)
Total Bilirubin: 0.6 mg/dL (ref 0.3–1.2)
Total Protein: 6.7 g/dL (ref 6.5–8.1)

## 2021-04-06 MED ORDER — PREDNISONE 50 MG PO TABS: 50.0000 mg | ORAL_TABLET | Freq: Every day | ORAL | 0 refills | Status: DC

## 2021-04-06 MED ORDER — IPRATROPIUM-ALBUTEROL 0.5-2.5 (3) MG/3ML IN SOLN
3.0000 mL | Freq: Once | RESPIRATORY_TRACT | Status: AC
  Administered 2021-04-06: 3 mL via RESPIRATORY_TRACT

## 2021-04-06 MED ORDER — ALBUTEROL SULFATE HFA 108 (90 BASE) MCG/ACT IN AERS: 2.0000 | INHALATION_SPRAY | Freq: Four times a day (QID) | RESPIRATORY_TRACT | 2 refills | Status: DC | PRN

## 2021-04-06 MED ORDER — PREDNISONE 20 MG PO TABS
60.0000 mg | ORAL_TABLET | Freq: Once | ORAL | Status: AC
Start: 2021-04-06 — End: 2021-04-06
  Administered 2021-04-06: 60 mg via ORAL
  Filled 2021-04-06: qty 3

## 2021-04-06 NOTE — ED Provider Notes (Signed)
? ?[facility] ?Provider Note ? ? ? Event Date/Time  ? First MD Initiated Contact with Patient 04/06/21 1028   ?  (approximate) ? ? ?History  ? ?Shortness of Breath ? ? ?HPI ? ?Jeremy Hodges is a 51 y.o. male with a history of asthma who presents with complaints of shortness of breath.  Patient reports he has had a dry cough over the last 24 hours, felt short of breath at work today and was sent to the emergency department.  He denies chest pain.  No pleurisy.  No calf pain or swelling.  No fevers chills ?  ? ? ?Physical Exam  ? ?Triage Vital Signs: ?ED Triage Vitals  ?Enc Vitals Group  ?   BP 04/06/21 0955 (!) 154/97  ?   Pulse Rate 04/06/21 0955 85  ?   Resp 04/06/21 0955 16  ?   Temp 04/06/21 0955 98.3 ?F (36.8 ?C)  ?   Temp Source 04/06/21 0955 Oral  ?   SpO2 04/06/21 0955 96 %  ?   Weight 04/06/21 0956 89 kg (196 lb 3.4 oz)  ?   Height 04/06/21 0956 1.676 m (5\' 6" )  ?   Head Circumference --   ?   Peak Flow --   ?   Pain Score 04/06/21 0956 0  ?   Pain Loc --   ?   Pain Edu? --   ?   Excl. in GC? --   ? ? ?Most recent vital signs: ?Vitals:  ? 04/06/21 0955 04/06/21 1134  ?BP: (!) 154/97 (!) 145/74  ?Pulse: 85 75  ?Resp: 16 17  ?Temp: 98.3 ?F (36.8 ?C) 98.1 ?F (36.7 ?C)  ?SpO2: 96% 98%  ? ? ? ?General: Awake, no distress.  ?CV:  Good peripheral perfusion.  ?Resp:  Normal effort.  Scattered wheezes ?Abd:  No distention.  ?Other:   ? ? ?ED Results / Procedures / Treatments  ? ?Labs ?(all labs ordered are listed, but only abnormal results are displayed) ?Labs Reviewed  ?CBC WITH DIFFERENTIAL/PLATELET  ?COMPREHENSIVE METABOLIC PANEL  ? ? ? ?EKG ? ?ED ECG REPORT ?I, 04/08/21, the attending physician, personally viewed and interpreted this ECG. ? ?Date: 04/06/2021 ? ?Rhythm: normal sinus rhythm ?QRS Axis: normal ?Intervals: normal ?ST/T Wave abnormalities: normal ?Narrative Interpretation: no evidence of acute ischemia ? ? ? ?RADIOLOGY ?Chest x-ray viewed interpreted by me, no acute  abnormality ? ? ? ?PROCEDURES: ? ?Critical Care performed:  ? ?Procedures ? ? ?MEDICATIONS ORDERED IN ED: ?Medications  ?predniSONE (DELTASONE) tablet 60 mg (60 mg Oral Given 04/06/21 1109)  ?ipratropium-albuterol (DUONEB) 0.5-2.5 (3) MG/3ML nebulizer solution 3 mL (3 mLs Nebulization Given 04/06/21 1109)  ?ipratropium-albuterol (DUONEB) 0.5-2.5 (3) MG/3ML nebulizer solution 3 mL (3 mLs Nebulization Given 04/06/21 1109)  ? ? ? ?IMPRESSION / MDM / ASSESSMENT AND PLAN / ED COURSE  ?I reviewed the triage vital signs and the nursing notes. ? ? ? ?Patient presents with shortness of breath as detailed above, overall well-appearing and in no acute distress, mild scattered wheezing on exam, suspect mild asthma exacerbation related to URI. ? ?Lab work demonstrates normal CBC, normal CMP,  ? ?Chest x-ray is reassuring without evidence of pneumonia. ? ?Patient treated with prednisone and albuterol with significant improvement.  Appropriate for discharge with outpatient follow-up.  Return precautions discussed ? ? ? ?  ? ? ?FINAL CLINICAL IMPRESSION(S) / ED DIAGNOSES  ? ?Final diagnoses:  ?Bronchospasm  ? ? ? ?Rx / DC Orders  ? ?  ED Discharge Orders   ? ?      Ordered  ?  albuterol (VENTOLIN HFA) 108 (90 Base) MCG/ACT inhaler  Every 6 hours PRN,   Status:  Discontinued       ? 04/06/21 1116  ?  predniSONE (DELTASONE) 50 MG tablet  Daily with breakfast,   Status:  Discontinued       ? 04/06/21 1116  ?  albuterol (VENTOLIN HFA) 108 (90 Base) MCG/ACT inhaler  Every 6 hours PRN       ? 04/06/21 1132  ?  predniSONE (DELTASONE) 50 MG tablet  Daily with breakfast       ? 04/06/21 1132  ? ?  ?  ? ?  ? ? ? ?Note:  This document was prepared using Dragon voice recognition software and may include unintentional dictation errors. ?  ?Jene Every, MD ?04/06/21 1457 ? ?

## 2021-04-06 NOTE — ED Triage Notes (Addendum)
Patient to ER via [facility] with complaints of non-productive cough and shortness of breath x2 days. Reports using prescribed inhaler with no relief in symptoms. Denies sick contacts.  ? ?Attempted to go to work today ([employer]), but was unable to say due to shortness of breath. 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  ?

## 2021-06-26 ENCOUNTER — Other Ambulatory Visit: Payer: Self-pay

## 2021-06-26 ENCOUNTER — Encounter: Payer: Self-pay | Admitting: Emergency Medicine

## 2021-06-26 ENCOUNTER — Emergency Department
Admission: EM | Admit: 2021-06-26 | Discharge: 2021-06-26 | Disposition: A | Discharge status: Home / Self Care | Payer: PRIVATE HEALTH INSURANCE | Attending: Emergency Medicine | Admitting: Emergency Medicine

## 2021-06-26 ENCOUNTER — Emergency Department: Payer: PRIVATE HEALTH INSURANCE

## 2021-06-26 DIAGNOSIS — Z760 Encounter for issue of repeat prescription: Secondary | ICD-10-CM | POA: Insufficient documentation

## 2021-06-26 DIAGNOSIS — I10 Essential (primary) hypertension: Secondary | ICD-10-CM | POA: Insufficient documentation

## 2021-06-26 DIAGNOSIS — J45909 Unspecified asthma, uncomplicated: Secondary | ICD-10-CM | POA: Insufficient documentation

## 2021-06-26 DIAGNOSIS — J9801 Acute bronchospasm: Secondary | ICD-10-CM

## 2021-06-26 LAB — CBC
HCT: 45.4 % (ref 39.0–52.0)
Hemoglobin: 14.4 g/dL (ref 13.0–17.0)
MCH: 29.4 pg (ref 26.0–34.0)
MCHC: 31.7 g/dL (ref 30.0–36.0)
MCV: 92.8 fL (ref 80.0–100.0)
Platelets: 229 10*3/uL (ref 150–400)
RBC: 4.89 MIL/uL (ref 4.22–5.81)
RDW: 14.9 % (ref 11.5–15.5)
WBC: 8.5 10*3/uL (ref 4.0–10.5)
nRBC: 0 % (ref 0.0–0.2)

## 2021-06-26 LAB — BASIC METABOLIC PANEL
Anion gap: 7 (ref 5–15)
BUN: 15 mg/dL (ref 6–20)
CO2: 26 mmol/L (ref 22–32)
Calcium: 9.1 mg/dL (ref 8.9–10.3)
Chloride: 107 mmol/L (ref 98–111)
Creatinine, Ser: 1.24 mg/dL (ref 0.61–1.24)
GFR, Estimated: >60 mL/min (ref >60)
Glucose, Bld: 99 mg/dL (ref 70–99)
Potassium: 4 mmol/L (ref 3.5–5.1)
Sodium: 140 mmol/L (ref 135–145)

## 2021-06-26 LAB — TROPONIN I (HIGH SENSITIVITY): Troponin I (High Sensitivity): 10 ng/L (ref <18)

## 2021-06-26 MED ORDER — ALBUTEROL SULFATE HFA 108 (90 BASE) MCG/ACT IN AERS: 2.0000 | INHALATION_SPRAY | Freq: Four times a day (QID) | RESPIRATORY_TRACT | 0 refills | Status: DC | PRN

## 2021-06-26 MED ORDER — PREDNISONE 20 MG PO TABS: 40.0000 mg | ORAL_TABLET | Freq: Every day | ORAL | 0 refills | Status: AC

## 2021-06-26 MED ORDER — PREDNISONE 20 MG PO TABS
40.0000 mg | ORAL_TABLET | Freq: Every day | ORAL | 0 refills | Status: DC
Start: 2021-06-26 — End: 2021-06-26

## 2021-06-26 NOTE — ED Provider Notes (Signed)
[facility] Provider Note    Event Date/Time   First MD Initiated Contact with Patient 06/26/21 [phone]     (approximate)   History   No chief complaint on file.   HPI  Jeremy Hodges is a 51 y.o. male with history of asthma who comes in with concerns for wheezing and coughing at nighttime,.  Patient reports that this has been going on for the past 2 to 3 weeks.  However he has had multiple episodes of this previously requiring the patient to come to the emergency room given he does not have a primary care doctor.  He reports that he is here in order to get a refill of his albuterol.  He does report that the steroids have also helped when he had that previously.  He denies any new leg swelling, calf tenderness, history of blood clots.   Physical Exam   Triage Vital Signs: ED Triage Vitals  Enc Vitals Group     BP 06/26/21 0745 (!) 178/116     Pulse Rate 06/26/21 0745 80     Resp 06/26/21 0745 18     Temp 06/26/21 0745 98.6 F (37 C)     Temp Source 06/26/21 0745 Oral     SpO2 06/26/21 0745 99 %     Weight 06/26/21 0805 196 lb 3.4 oz (89 kg)     Height 06/26/21 0805 5\' 6"  (1.676 m)     Head Circumference --      Peak Flow --      Pain Score 06/26/21 0720 0     Pain Loc --      Pain Edu? --      Excl. in [place]? --     Most recent vital signs: Vitals:   06/26/21 0745  BP: (!) 178/116  Pulse: 80  Resp: 18  Temp: 98.6 F (37 C)  SpO2: 99%     General: Awake, no distress.  CV:  Good peripheral perfusion.  Resp:  Normal effort.  Clear lungs at this time Abd:  No distention.  Other:  No calf swelling.  No tenderness   ED Results / Procedures / Treatments   Labs (all labs ordered are listed, but only abnormal results are displayed) Labs Reviewed  BASIC METABOLIC PANEL  CBC  TROPONIN I (HIGH SENSITIVITY)     EKG  My interpretation of EKG:  Normal sinus rate of 78 without any ST elevation but does have a T wave version in lead III and  aVF with normal intervals  -I reviewed his prior EKG from 3/20 where he had similar T wave inversions  RADIOLOGY I have reviewed the chest x-ray personally and interpreted there is no evidence of any pneumonia   PROCEDURES:  Critical Care performed: No  Procedures   MEDICATIONS ORDERED IN ED: Medications - No data to display   IMPRESSION / MDM / [place] / ED COURSE  I reviewed the triage vital signs and the nursing notes.   Patient's presentation is most consistent with acute presentation with potential threat to life or bodily function.   This concerning for possible ACS, asthma exacerbation, pneumonia.  Low suspicion for pulmonary embolism given no other prior risk factors for this and seems pretty consistent with his prior asthma flares.  He reports near complete resolution of symptoms when he uses his albuterol inhaler and given he has run out he is here for a refill  Troponin is negative and this is been going  on for many weeks I do not feel the need to get a repeat.  BMP normal.  CBC normal  Patient's blood pressure is significantly elevated.  He is supposed to be on amlodipine and he reports having medication at home but not being compliant.  We discussed continuing his amlodipine and following up with his primary care doctor for a recheck.  He is going to work on getting a primary doctor in the next few weeks.   FINAL CLINICAL IMPRESSION(S) / ED DIAGNOSES   Final diagnoses:  Bronchospasm  Hypertension, unspecified type     Rx / DC Orders   ED Discharge Orders          Ordered    albuterol (VENTOLIN HFA) 108 (90 Base) MCG/ACT inhaler  Every 6 hours PRN        06/26/21 0854    predniSONE (DELTASONE) 20 MG tablet  Daily with breakfast        06/26/21 0854             Note:  This document was prepared using Dragon voice recognition software and may include unintentional dictation errors.   Vanessa [place], MD 06/26/21 [phone]

## 2021-06-26 NOTE — ED Triage Notes (Signed)
C/O wheezing and cough, worse at night, x several months.  States had asthma as a child, but thought he had grown out of it.  AAOx3.  Skin warm and dry. NAD.  No SOB/ DOE

## 2021-06-26 NOTE — Discharge Instructions (Addendum)
Your symptoms are concerning for asthma and it is important that you cut down your smoking or try to stop.  In the meantime we put you on some steroids and an inhaler to try to help with your wheezing that you experience.  You should make a follow-up appoint with a primary care doctor and when you get followed up with get a recheck of your blood pressure given it was elevated today you may need to be started on some medications for this as well.  Restart your amlodipine that you have at home

## 2021-06-26 NOTE — ED Notes (Signed)
Return from xray

## 2022-01-07 ENCOUNTER — Emergency Department
Admission: EM | Admit: 2022-01-07 | Discharge: 2022-01-07 | Disposition: A | Discharge status: Home / Self Care | Payer: Self-pay | Attending: Student in an Organized Health Care Education/Training Program | Admitting: Student in an Organized Health Care Education/Training Program

## 2022-01-07 ENCOUNTER — Other Ambulatory Visit: Payer: Self-pay

## 2022-01-07 ENCOUNTER — Emergency Department: Payer: Self-pay

## 2022-01-07 DIAGNOSIS — U071 COVID-19: Secondary | ICD-10-CM | POA: Insufficient documentation

## 2022-01-07 DIAGNOSIS — Z87891 Personal history of nicotine dependence: Secondary | ICD-10-CM | POA: Insufficient documentation

## 2022-01-07 LAB — RESP PANEL BY RT-PCR (RSV, FLU A&B, COVID)  RVPGX2
Influenza A by PCR: NEGATIVE
Influenza B by PCR: NEGATIVE
Resp Syncytial Virus by PCR: NEGATIVE
SARS Coronavirus 2 by RT PCR: POSITIVE — AB

## 2022-01-07 MED ORDER — NIRMATRELVIR/RITONAVIR (PAXLOVID)TABLET: 3.0000 | ORAL_TABLET | Freq: Two times a day (BID) | ORAL | 0 refills | Status: AC

## 2022-01-07 MED ORDER — ACETAMINOPHEN 325 MG PO TABS
650.0000 mg | ORAL_TABLET | Freq: Once | ORAL | Status: AC
  Administered 2022-01-07: 650 mg via ORAL
  Filled 2022-01-07: qty 2

## 2022-01-07 NOTE — ED Provider Notes (Signed)
[facility] Provider Note    Event Date/Time   First MD Initiated Contact with Patient 01/07/22 0800     (approximate)   History   Nasal Congestion   HPI  Jeremy Hodges is a 51 y.o. male who presents today for evaluation of congestion, dry cough, body aches for the past 3 days.  Patient reports that he has not had a fever.  He has not received any of the COVID vaccinations.  He denies chest pain or shortness of breath.  He denies abdominal pain, nausea, vomiting, diarrhea.  He is unsure of any sick contacts.  Patient Active Problem List   Diagnosis Date Noted   Palpitations 01/26/2017   Paroxysmal tachycardia (HCC) 01/26/2017   Smoker 01/26/2017          Physical Exam   Triage Vital Signs: ED Triage Vitals [01/07/22 0730]  Enc Vitals Group     BP (!) 149/108     Pulse Rate (!) 102     Resp 20     Temp 98.8 F (37.1 C)     Temp Source Oral     SpO2 96 %     Weight 194 lb (88 kg)     Height 5\' 6"  (1.676 m)     Head Circumference      Peak Flow      Pain Score 7     Pain Loc      Pain Edu?      Excl. in GC?     Most recent vital signs: Vitals:   01/07/22 0730 01/07/22 0843  BP: (!) 149/108 (!) 148/111  Pulse: (!) 102 100  Resp: 20 16  Temp: 98.8 F (37.1 C)   SpO2: 96% 94%    Physical Exam Vitals and nursing note reviewed.  Constitutional:      General: Awake and alert. No acute distress.    Appearance: Normal appearance. The patient is normal weight.  HENT:     Head: Normocephalic and atraumatic.     Mouth: Mucous membranes are moist.  Eyes:     General: PERRL. Normal EOMs        Right eye: No discharge.        Left eye: No discharge.     Conjunctiva/sclera: Conjunctivae normal.  Cardiovascular:     Rate and Rhythm: Normal rate and regular rhythm.     Pulses: Normal pulses.     Heart sounds: Normal heart sounds Pulmonary:     Effort: Pulmonary effort is normal. No respiratory distress.     Breath sounds: Normal  breath sounds.  Able to speak easily in complete sentences Abdominal:     Abdomen is soft. There is no abdominal tenderness. No rebound or guarding. No distention. Musculoskeletal:        General: No swelling. Normal range of motion.     Cervical back: Normal range of motion and neck supple.  Skin:    General: Skin is warm and dry.     Capillary Refill: Capillary refill takes less than 2 seconds.     Findings: No rash.  Neurological:     Mental Status: The patient is awake and alert.      ED Results / Procedures / Treatments   Labs (all labs ordered are listed, but only abnormal results are displayed) Labs Reviewed  RESP PANEL BY RT-PCR (RSV, FLU A&B, COVID)  RVPGX2 - Abnormal; Notable for the following components:      Result Value  SARS Coronavirus 2 by RT PCR POSITIVE (*)    All other components within normal limits     EKG     RADIOLOGY I independently reviewed and interpreted imaging and agree with radiologists findings.     PROCEDURES:  Critical Care performed:   Procedures   MEDICATIONS ORDERED IN ED: Medications  acetaminophen (TYLENOL) tablet 650 mg (650 mg Oral Given 01/07/22 0842)     IMPRESSION / MDM / ASSESSMENT AND PLAN / ED COURSE  I reviewed the triage vital signs and the nursing notes.   Differential diagnosis includes, but is not limited to, COVID, influenza, pneumonia, bronchitis, other URI.  Patient is awake and alert, hemodynamically stable and afebrile.  He is able to speak easily in complete sentences and has normal lung sounds bilaterally, x-ray obtained in triage is negative for pneumonia or other cardiopulmonary abnormality.  Swab also obtained in triage is positive for COVID-19.  Discussed the risk/benefits of initiating Paxlovid and patient would like to start on this medication.  Reviewed his medications with him, he reports that he is not currently taking any medications.  I reviewed the patient's chart, most recent blood work 6  months ago revealed normal GFR.  We discussed quarantine instructions and he was given a work note.  We discussed return precautions and outpatient follow-up.  Patient or stands and agrees with plan.  He was discharged in stable condition.   Patient's presentation is most consistent with acute complicated illness / injury requiring diagnostic workup.      FINAL CLINICAL IMPRESSION(S) / ED DIAGNOSES   Final diagnoses:  COVID-19     Rx / DC Orders   ED Discharge Orders          Ordered    nirmatrelvir/ritonavir EUA (PAXLOVID) 20 x 150 MG & 10 x 100MG  TABS  2 times daily        01/07/22 0840             Note:  This document was prepared using Dragon voice recognition software and may include unintentional dictation errors.   01/09/22 01/07/22 01/09/22    1610, MD 01/07/22 1158

## 2022-01-07 NOTE — Discharge Instructions (Addendum)
You have tested positive for COVID-19.  Please remember that you must quarantine unless you require medical attention.  You may also take the Paxlovid as prescribed.  You can continue to take Tylenol/ibuprofen per package instructions as needed for body aches or fever.  Please return for any new, worsening, or change in symptoms or other concerns.

## 2022-01-07 NOTE — ED Triage Notes (Signed)
Pt in with congestion, cough, chills and body aches. STates worsened today, states feels shoboe.

## 2022-03-11 ENCOUNTER — Emergency Department
Admission: EM | Admit: 2022-03-11 | Discharge: 2022-03-11 | Disposition: A | Discharge status: Home / Self Care | Payer: Self-pay | Attending: Emergency Medicine | Admitting: Emergency Medicine

## 2022-03-11 ENCOUNTER — Other Ambulatory Visit: Payer: Self-pay

## 2022-03-11 ENCOUNTER — Emergency Department: Payer: Self-pay

## 2022-03-11 DIAGNOSIS — J4520 Mild intermittent asthma, uncomplicated: Secondary | ICD-10-CM | POA: Insufficient documentation

## 2022-03-11 DIAGNOSIS — D72829 Elevated white blood cell count, unspecified: Secondary | ICD-10-CM | POA: Insufficient documentation

## 2022-03-11 DIAGNOSIS — I10 Essential (primary) hypertension: Secondary | ICD-10-CM | POA: Insufficient documentation

## 2022-03-11 DIAGNOSIS — Z1152 Encounter for screening for COVID-19: Secondary | ICD-10-CM | POA: Insufficient documentation

## 2022-03-11 LAB — BASIC METABOLIC PANEL
Anion gap: 7 (ref 5–15)
BUN: 14 mg/dL (ref 6–20)
CO2: 30 mmol/L (ref 22–32)
Calcium: 9.3 mg/dL (ref 8.9–10.3)
Chloride: 100 mmol/L (ref 98–111)
Creatinine, Ser: 1.19 mg/dL (ref 0.61–1.24)
GFR, Estimated: >60 mL/min (ref >60)
Glucose, Bld: 106 mg/dL — ABNORMAL HIGH (ref 70–99)
Potassium: 3.8 mmol/L (ref 3.5–5.1)
Sodium: 137 mmol/L (ref 135–145)

## 2022-03-11 LAB — CBC
HCT: 47.2 % (ref 39.0–52.0)
Hemoglobin: 15.1 g/dL (ref 13.0–17.0)
MCH: 29.4 pg (ref 26.0–34.0)
MCHC: 32 g/dL (ref 30.0–36.0)
MCV: 91.8 fL (ref 80.0–100.0)
Platelets: 224 10*3/uL (ref 150–400)
RBC: 5.14 MIL/uL (ref 4.22–5.81)
RDW: 13.6 % (ref 11.5–15.5)
WBC: 13.9 10*3/uL — ABNORMAL HIGH (ref 4.0–10.5)
nRBC: 0 % (ref 0.0–0.2)

## 2022-03-11 LAB — RESP PANEL BY RT-PCR (RSV, FLU A&B, COVID)  RVPGX2
Influenza A by PCR: NEGATIVE
Influenza B by PCR: NEGATIVE
Resp Syncytial Virus by PCR: NEGATIVE
SARS Coronavirus 2 by RT PCR: NEGATIVE

## 2022-03-11 LAB — TROPONIN I (HIGH SENSITIVITY): Troponin I (High Sensitivity): 6 ng/L (ref <18)

## 2022-03-11 LAB — MAGNESIUM: Magnesium: 1.7 mg/dL (ref 1.7–2.4)

## 2022-03-11 LAB — BRAIN NATRIURETIC PEPTIDE: B Natriuretic Peptide: 17 pg/mL (ref 0.0–100.0)

## 2022-03-11 MED ORDER — IPRATROPIUM-ALBUTEROL 0.5-2.5 (3) MG/3ML IN SOLN
3.0000 mL | Freq: Once | RESPIRATORY_TRACT | Status: AC
  Administered 2022-03-11: 3 mL via RESPIRATORY_TRACT
  Filled 2022-03-11: qty 3

## 2022-03-11 MED ORDER — DILTIAZEM HCL ER COATED BEADS 180 MG PO CP24
180.0000 mg | ORAL_CAPSULE | Freq: Once | ORAL | Status: DC
  Filled 2022-03-11: qty 1

## 2022-03-11 MED ORDER — ACETAMINOPHEN 500 MG PO TABS
1000.0000 mg | ORAL_TABLET | Freq: Once | ORAL | Status: AC
  Administered 2022-03-11: 1000 mg via ORAL
  Filled 2022-03-11: qty 2

## 2022-03-11 MED ORDER — DILTIAZEM HCL 60 MG PO TABS
60.0000 mg | ORAL_TABLET | Freq: Once | ORAL | Status: AC
  Administered 2022-03-11: 60 mg via ORAL
  Filled 2022-03-11: qty 1

## 2022-03-11 MED ORDER — AMOXICILLIN-POT CLAVULANATE 875-125 MG PO TABS: 1.0000 | ORAL_TABLET | Freq: Two times a day (BID) | ORAL | 0 refills | Status: AC

## 2022-03-11 MED ORDER — DOXYCYCLINE MONOHYDRATE 100 MG PO TABS: 100.0000 mg | ORAL_TABLET | Freq: Two times a day (BID) | ORAL | 0 refills | Status: AC

## 2022-03-11 MED ORDER — PREDNISONE 20 MG PO TABS: 40.0000 mg | ORAL_TABLET | Freq: Every day | ORAL | 0 refills | Status: AC

## 2022-03-11 MED ORDER — PREDNISONE 20 MG PO TABS
60.0000 mg | ORAL_TABLET | Freq: Once | ORAL | Status: AC
  Administered 2022-03-11: 60 mg via ORAL
  Filled 2022-03-11: qty 3

## 2022-03-11 MED ORDER — ALBUTEROL SULFATE HFA 108 (90 BASE) MCG/ACT IN AERS: 2.0000 | INHALATION_SPRAY | Freq: Four times a day (QID) | RESPIRATORY_TRACT | 2 refills | Status: DC | PRN

## 2022-03-11 NOTE — ED Notes (Signed)
Pt ambulated in the room while wearing the monitoring equipment. Pt was able to keep O2 sat in the mid to upper 90s. Pt stated they "felt tired and hot but cold at the same time." Reminded pt they are running a fever and that is to be expected. MD notified of ambulation and O2 sats.

## 2022-03-11 NOTE — Discharge Instructions (Signed)
You need to follow-up with cardiologist to discuss your elevated heart rates and to get a monitor.  Reassuring you on antibiotics for possible pneumonia as well as an inhaler and some steroids.  We recommended CT imaging or further imaging of your lungs to rule out blood clots but you have opted decline if you develop worsening shortness of breath return to the ER for repeat evaluation or any other concerns

## 2022-03-11 NOTE — ED Notes (Signed)
Pt stated that about 3 months ago was having a high heart rate, had to be seen in the ER in [facility] and "had to blow in the syringe and do a half flip" and "after doing it the second time, it worked." Pt's wife said that she believes that "it's because pt was using the albuterol inhaler too often." Pt states he was using the inhaler because "I couldn't breathe, and the inhaler wasn't working."

## 2022-03-11 NOTE — ED Provider Notes (Signed)
[facility] Provider Note    Event Date/Time   First MD Initiated Contact with Patient 03/11/22 1137     (approximate)   History   Shortness of Breath (Pt. To ED for SOB since 0200. Pt. States lots of chest pressure when laying down. Pt. States he was told by nurse at work that he has a-fib.)   HPI  Jeremy Hodges is a 52 y.o. male here with cough, shortness of breath, chest pressure when lying down.  The patient states that for the last 2 to 3 days, has had progressively worsening cough, general fatigue.  He states that over the last 24 hours, he had acute worsening of his shortness of breath.  He felt like he could not breathe last night.  He had to sit up.  Try to go to work today and felt very weak and short of breath so he presents for evaluation.  History of mild asthma and has required albuterol in the past.  Also states he had high blood pressure in the past but no longer takes his medications as he was worried about side effects.  Denies any known sick contacts.     Physical Exam   Triage Vital Signs: ED Triage Vitals  Enc Vitals Group     BP 03/11/22 1110 (!) 171/112     Pulse Rate 03/11/22 1110 (!) 110     Resp 03/11/22 1110 20     Temp 03/11/22 1110 98.2 F (36.8 C)     Temp Source 03/11/22 1110 Oral     SpO2 03/11/22 1110 94 %     Weight --      Height 03/11/22 1111 5' 6"$  (1.676 m)     Head Circumference --      Peak Flow --      Pain Score 03/11/22 1111 0     Pain Loc --      Pain Edu? --      Excl. in [place]? --     Most recent vital signs: Vitals:   03/11/22 1830 03/11/22 1900  BP: (!) 143/92 (!) 149/98  Pulse: 88 84  Resp: 19 18  Temp:    SpO2: 95% 93%     General: Awake, no distress.  CV:  Good peripheral perfusion.  Tachycardic.  Regular. Resp:  Normal effort.  End expiratory wheezes noted with forced, prolonged expiration. Abd:  No distention.  Other:  No lower extremity edema or asymmetry.   ED Results / Procedures /  Treatments   Labs (all labs ordered are listed, but only abnormal results are displayed) Labs Reviewed  BASIC METABOLIC PANEL - Abnormal; Notable for the following components:      Result Value   Glucose, Bld 106 (*)    All other components within normal limits  CBC - Abnormal; Notable for the following components:   WBC 13.9 (*)    All other components within normal limits  RESP PANEL BY RT-PCR (RSV, FLU A&B, COVID)  RVPGX2  CULTURE, BLOOD (SINGLE)  BRAIN NATRIURETIC PEPTIDE  MAGNESIUM  TROPONIN I (HIGH SENSITIVITY)  TROPONIN I (HIGH SENSITIVITY)     EKG Sinus tachycardia with PACs.  PR 144, QRS 70, QTc 454.  No acute ST elevation or depression.  No acute evidence of acute ischemia or infarct.   RADIOLOGY Chest x-ray: Normal exam   I also independently reviewed and agree with radiologist interpretations.   PROCEDURES:  Critical Care performed: No   MEDICATIONS ORDERED IN ED: Medications  ipratropium-albuterol (DUONEB) 0.5-2.5 (3) MG/3ML nebulizer solution 3 mL (3 mLs Nebulization Given 03/11/22 1325)  diltiazem (CARDIZEM) tablet 60 mg (60 mg Oral Given 03/11/22 1323)  acetaminophen (TYLENOL) tablet 1,000 mg (1,000 mg Oral Given 03/11/22 1517)  ipratropium-albuterol (DUONEB) 0.5-2.5 (3) MG/3ML nebulizer solution 3 mL (3 mLs Nebulization Given 03/11/22 1521)  predniSONE (DELTASONE) tablet 60 mg (60 mg Oral Given 03/11/22 1518)  ipratropium-albuterol (DUONEB) 0.5-2.5 (3) MG/3ML nebulizer solution 3 mL (3 mLs Nebulization Given 03/11/22 1521)     IMPRESSION / MDM / ASSESSMENT AND PLAN / ED COURSE  I reviewed the triage vital signs and the nursing notes.                              Differential diagnosis includes, but is not limited to, asthma/bronchospasm, PNA, CHF, ACS, HTN urgency, influenza/URI, COVID-19, anemia  Patient's presentation is most consistent with acute presentation with potential threat to life or bodily function.  The patient is on the cardiac monitor  to evaluate for evidence of arrhythmia and/or significant heart rate changes.  52 yo M with PMHx paroxysmal tachycardia, RAD here with SOB, palpitations. Pt has scant wheezing on expiration on exam, mild tachypnea but o/w appears well. Sinus tachycardia noted with no AFib. Labs show leukocytosis, WBC 13.9. BMP unremarkable. Trop negative. CXR clear.   Pt then spiked fever to 100.3, suspect possible flu or covid-19. Will give additional nebs, prednisone, tylenol, and reassess.  FINAL CLINICAL IMPRESSION(S) / ED DIAGNOSES   Final diagnoses:  Intermittent asthma, unspecified asthma severity, unspecified whether complicated     Rx / DC Orders   ED Discharge Orders          Ordered    albuterol (VENTOLIN HFA) 108 (90 Base) MCG/ACT inhaler  Every 6 hours PRN        03/11/22 1917    predniSONE (DELTASONE) 20 MG tablet  Daily with breakfast        03/11/22 1917    doxycycline (ADOXA) 100 MG tablet  2 times daily        03/11/22 1917    amoxicillin-clavulanate (AUGMENTIN) 875-125 MG tablet  2 times daily        03/11/22 1917    Ambulatory referral to Cardiology        03/11/22 1917             Note:  This document was prepared using Dragon voice recognition software and may include unintentional dictation errors.   Duffy Bruce, MD 03/11/22 2002

## 2022-03-11 NOTE — ED Provider Notes (Signed)
4:49 PM Assumed care for off going team.   Blood pressure (!) 148/89, pulse 94, temperature 100.3 F (37.9 C), temperature source Oral, resp. rate 17, height 5' 6"$  (1.676 m), SpO2 99 %.  See their HPI for full report but in brief pending COVID and flu and reevaluation  Patient's COVID and flu test were negative.  Reevaluated patient.  Discussed with patient tachycardia, negative x-ray fevers shortness of breath concern for possible PE.  Discussed that this could just be a viral illness or pneumonia not seen on chest x-ray but a CT would be the best way to know for sure what was going on.  Patient was willing to proceed with CT imaging  Patient has a questionable allergy to contrast.  He is not actually sure if he actually has an allergy or not we discussed doing a prep but he does not want to stay for that.  We discussed the risk for missing a PE including death or permanent disability he expressed understanding but he stated that he wanted to leave.  He is at least willing to do ultrasounds of his legs which were negative.  Unfortunately was caught up in a critical room patient and patient was stating that he needed to leave now.  I was able to write him prescriptions including antibiotics in case there was a bacterial pneumonia that just being missed given his low-grade temperature.  His sats have been above 90% and his heart rates have come down and patient declines admission.  Patient given a cardiology referral for follow-up.  But at this time patient would like to be discharged home.       Vanessa [place], MD 03/11/22 1919

## 2022-03-11 NOTE — ED Triage Notes (Addendum)
Pt. To ED for SOB since 0200. Pt. States lots of chest pressure when laying down. Pt. States he was told by nurse at work that he has a-fib. Pt. States hx of HTN, but he down ont take medication that was prescribed to him.

## 2022-03-16 LAB — CULTURE, BLOOD (SINGLE): Culture: NO GROWTH

## 2022-05-17 ENCOUNTER — Other Ambulatory Visit: Payer: Self-pay

## 2022-05-17 ENCOUNTER — Emergency Department: Payer: Self-pay

## 2022-05-17 ENCOUNTER — Emergency Department
Admission: EM | Admit: 2022-05-17 | Discharge: 2022-05-17 | Disposition: A | Discharge status: Home / Self Care | Payer: Self-pay | Attending: Emergency Medicine | Admitting: Emergency Medicine

## 2022-05-17 DIAGNOSIS — R0602 Shortness of breath: Secondary | ICD-10-CM

## 2022-05-17 DIAGNOSIS — J4541 Moderate persistent asthma with (acute) exacerbation: Secondary | ICD-10-CM | POA: Insufficient documentation

## 2022-05-17 DIAGNOSIS — I10 Essential (primary) hypertension: Secondary | ICD-10-CM | POA: Insufficient documentation

## 2022-05-17 LAB — COMPREHENSIVE METABOLIC PANEL
ALT: 25 U/L (ref 0–44)
AST: 30 U/L (ref 15–41)
Albumin: 3.6 g/dL (ref 3.5–5.0)
Alkaline Phosphatase: 53 U/L (ref 38–126)
Anion gap: 7 (ref 5–15)
BUN: 18 mg/dL (ref 6–20)
CO2: 28 mmol/L (ref 22–32)
Calcium: 8.5 mg/dL — ABNORMAL LOW (ref 8.9–10.3)
Chloride: 104 mmol/L (ref 98–111)
Creatinine, Ser: 1.27 mg/dL — ABNORMAL HIGH (ref 0.61–1.24)
GFR, Estimated: >60 mL/min (ref >60)
Glucose, Bld: 96 mg/dL (ref 70–99)
Potassium: 4.3 mmol/L (ref 3.5–5.1)
Sodium: 139 mmol/L (ref 135–145)
Total Bilirubin: 1 mg/dL (ref 0.3–1.2)
Total Protein: 6.3 g/dL — ABNORMAL LOW (ref 6.5–8.1)

## 2022-05-17 LAB — CBC
HCT: 45.6 % (ref 39.0–52.0)
Hemoglobin: 14.7 g/dL (ref 13.0–17.0)
MCH: 30.2 pg (ref 26.0–34.0)
MCHC: 32.2 g/dL (ref 30.0–36.0)
MCV: 93.6 fL (ref 80.0–100.0)
Platelets: 205 10*3/uL (ref 150–400)
RBC: 4.87 MIL/uL (ref 4.22–5.81)
RDW: 13.8 % (ref 11.5–15.5)
WBC: 6.8 10*3/uL (ref 4.0–10.5)
nRBC: 0 % (ref 0.0–0.2)

## 2022-05-17 LAB — TROPONIN I (HIGH SENSITIVITY): Troponin I (High Sensitivity): 8 ng/L (ref <18)

## 2022-05-17 MED ORDER — ALBUTEROL SULFATE HFA 108 (90 BASE) MCG/ACT IN AERS: 2.0000 | INHALATION_SPRAY | Freq: Four times a day (QID) | RESPIRATORY_TRACT | 2 refills | Status: AC | PRN

## 2022-05-17 MED ORDER — FLUTICASONE-SALMETEROL 45-21 MCG/ACT IN AERO: 2.0000 | INHALATION_SPRAY | Freq: Two times a day (BID) | RESPIRATORY_TRACT | 12 refills | Status: AC

## 2022-05-17 MED ORDER — IPRATROPIUM-ALBUTEROL 0.5-2.5 (3) MG/3ML IN SOLN
RESPIRATORY_TRACT | Status: AC
  Filled 2022-05-17: qty 3

## 2022-05-17 MED ORDER — METHYLPREDNISOLONE SODIUM SUCC 125 MG IJ SOLR
125.0000 mg | Freq: Once | INTRAMUSCULAR | Status: AC
  Administered 2022-05-17: 125 mg via INTRAVENOUS
  Filled 2022-05-17: qty 2

## 2022-05-17 MED ORDER — IPRATROPIUM-ALBUTEROL 0.5-2.5 (3) MG/3ML IN SOLN
3.0000 mL | Freq: Once | RESPIRATORY_TRACT | Status: AC
  Administered 2022-05-17: 3 mL via RESPIRATORY_TRACT
  Filled 2022-05-17: qty 3

## 2022-05-17 MED ORDER — METHYLPREDNISOLONE 4 MG PO TBPK: ORAL_TABLET | ORAL | 0 refills | Status: AC

## 2022-05-17 NOTE — ED Provider Notes (Signed)
[facility] Provider Note   Event Date/Time   First MD Initiated Contact with Patient 05/17/22 [phone]     (approximate) History  Shortness of Breath  HPI Jeremy Hodges is a 52 y.o. male with stated past medical history of asthma who presents complaining of shortness of breath has been worsening over the last 2-3 days.  Patient states that he has to keep a fan on in his room blowing on him to feel that he is getting air.  Patient also endorses mild chest tightness that is 2/10, nonradiating.  Patient states should be taking albuterol inhaler but has not been using it recently.  Denies any recent travel or sick contacts ROS: Patient currently denies any vision changes, tinnitus, difficulty speaking, facial droop, sore throat, chest pain, abdominal pain, nausea/vomiting/diarrhea, dysuria, or weakness/numbness/paresthesias in any extremity   Physical Exam  Triage Vital Signs: ED Triage Vitals  Enc Vitals Group     BP 05/17/22 0731 (!) 165/113     Pulse Rate 05/17/22 0731 73     Resp 05/17/22 0731 20     Temp 05/17/22 0731 97.6 F (36.4 C)     Temp Source 05/17/22 0731 Oral     SpO2 05/17/22 0731 98 %     Weight 05/17/22 0728 198 lb (89.8 kg)     Height 05/17/22 0728 5\' 6"  (1.676 m)     Head Circumference --      Peak Flow --      Pain Score 05/17/22 0727 2     Pain Loc --      Pain Edu? --      Excl. in GC? --    Most recent vital signs: Vitals:   05/17/22 0800 05/17/22 0830  BP: (!) 167/108 (!) 168/115  Pulse: 81 74  Resp: 18 20  Temp:    SpO2: 94% 95%   General: Awake, oriented x4. CV:  Good peripheral perfusion.  Resp:  Normal effort.  Mild expiratory wheezing over bilateral lung fields Abd:  No distention.  Other:  Middle-aged overweight African-American male laying in bed in no acute distress ED Results / Procedures / Treatments  Labs (all labs ordered are listed, but only abnormal results are displayed) Labs Reviewed  COMPREHENSIVE METABOLIC  PANEL - Abnormal; Notable for the following components:      Result Value   Creatinine, Ser 1.27 (*)    Calcium 8.5 (*)    Total Protein 6.3 (*)    All other components within normal limits  CBC  TROPONIN I (HIGH SENSITIVITY)  TROPONIN I (HIGH SENSITIVITY)   EKG ED ECG REPORT I, Merwyn Katos, the attending physician, personally viewed and interpreted this ECG. Date: 05/17/2022 EKG Time: 0729 Rate: 75 Rhythm: normal sinus rhythm QRS Axis: normal Intervals: normal ST/T Wave abnormalities: normal Narrative Interpretation: no evidence of acute ischemia RADIOLOGY ED MD interpretation: 2 view chest x-ray interpreted by me shows no evidence of acute abnormalities including no pneumonia, pneumothorax, or widened mediastinum -Agree with radiology assessment Official radiology report(s): DG Chest 2 View  Result Date: 05/17/2022 CLINICAL DATA:  Shortness of breath and intermittent left-sided chest pain. EXAM: CHEST - 2 VIEW COMPARISON:  Chest radiograph 03/11/2022 FINDINGS: The cardiomediastinal silhouette is normal There is no focal consolidation or pulmonary edema. There is no pleural effusion or pneumothorax There is no acute osseous abnormality. IMPRESSION: No radiographic evidence of acute cardiopulmonary process. Electronically Signed   By: Lesia Hausen M.D.   On: 05/17/2022 07:59  PROCEDURES: Critical Care performed: No .1-3 Lead EKG Interpretation  Performed by: Merwyn Katos, MD Authorized by: Merwyn Katos, MD     Interpretation: normal     ECG rate:  71   ECG rate assessment: normal     Rhythm: sinus rhythm     Ectopy: none     Conduction: normal    MEDICATIONS ORDERED IN ED: Medications  ipratropium-albuterol (DUONEB) 0.5-2.5 (3) MG/3ML nebulizer solution (  Not Given 05/17/22 0808)  ipratropium-albuterol (DUONEB) 0.5-2.5 (3) MG/3ML nebulizer solution 3 mL (3 mLs Nebulization Given 05/17/22 0755)  methylPREDNISolone sodium succinate (SOLU-MEDROL) 125 mg/2 mL  injection 125 mg (125 mg Intravenous Given 05/17/22 0755)   IMPRESSION / MDM / ASSESSMENT AND PLAN / ED COURSE  I reviewed the triage vital signs and the nursing notes.                             The patient is on the cardiac monitor to evaluate for evidence of arrhythmia and/or significant heart rate changes. Patient's presentation is most consistent with acute presentation with potential threat to life or bodily function. The patient appears to be suffering from a moderate exacerbation of COPD.  Based on the history, exam, CXR/EKG, and further workup I dont suspect any other emergent cause of this presentation, such as pneumonia, acute coronary syndrome, congestive heart failure, pulmonary embolism, or pneumothorax.  ED Interventions: bronchodilators, steroids, antibiotics, reassess  0910 Reassessment: After treatment, the patients shortness of breath is resolved, and their lung exam has returned to baseline. They are comfortable and want to go home.  Rx: Steroids, Antibiotics, Albuterol Disposition: Discharge home with SRP. PCP follow up recommended in next 48hours.   FINAL CLINICAL IMPRESSION(S) / ED DIAGNOSES   Final diagnoses:  Moderate persistent asthma with exacerbation  SOB (shortness of breath)   Rx / DC Orders   ED Discharge Orders          Ordered    Ambulatory Referral to Primary Care (Establish Care)        05/17/22 0906    albuterol (VENTOLIN HFA) 108 (90 Base) MCG/ACT inhaler  Every 6 hours PRN        05/17/22 0906    methylPREDNISolone (MEDROL DOSEPAK) 4 MG TBPK tablet        05/17/22 0906    fluticasone-salmeterol (ADVAIR HFA) 45-21 MCG/ACT inhaler  2 times daily        05/17/22 5366           Note:  This document was prepared using Dragon voice recognition software and may include unintentional dictation errors.   Merwyn Katos, MD 05/17/22 [phone]

## 2022-05-17 NOTE — ED Triage Notes (Signed)
Pt to Ed via POV from home. Pt reports SOB started when trying to get ready this morning. Pt also reports intermittent left sided CP. Pt reports everyday smoker. Pt with hx asthma and HTN.

## 2022-08-01 ENCOUNTER — Other Ambulatory Visit: Payer: Self-pay

## 2022-08-01 ENCOUNTER — Encounter: Payer: Self-pay | Admitting: Intensive Care

## 2022-08-01 ENCOUNTER — Emergency Department: Payer: Self-pay

## 2022-08-01 ENCOUNTER — Emergency Department
Admission: EM | Admit: 2022-08-01 | Discharge: 2022-08-01 | Disposition: A | Discharge status: Home / Self Care | Payer: Self-pay | Attending: Student in an Organized Health Care Education/Training Program | Admitting: Student in an Organized Health Care Education/Training Program

## 2022-08-01 DIAGNOSIS — M25572 Pain in left ankle and joints of left foot: Secondary | ICD-10-CM | POA: Insufficient documentation

## 2022-08-01 DIAGNOSIS — X500XXA Overexertion from strenuous movement or load, initial encounter: Secondary | ICD-10-CM | POA: Insufficient documentation

## 2022-08-01 DIAGNOSIS — F17200 Nicotine dependence, unspecified, uncomplicated: Secondary | ICD-10-CM | POA: Insufficient documentation

## 2022-08-01 MED ORDER — ACETAMINOPHEN 325 MG PO TABS
650.0000 mg | ORAL_TABLET | Freq: Once | ORAL | Status: AC
  Administered 2022-08-01: 650 mg via ORAL
  Filled 2022-08-01: qty 2

## 2022-08-01 NOTE — ED Provider Notes (Signed)
[facility] Provider Note    Event Date/Time   First MD Initiated Contact with Patient 08/01/22 [phone]     (approximate)   History   Ankle Pain   HPI  Jeremy Hodges is a 52 y.o. male who presents today for evaluate left ankle pain.  Patient reports that he had a misstep 2 days ago and twisted his ankle.  He reports that he did not fall to the ground.  He has been able to ambulate, though has pain with ambulation.  He reports that he has pain across the front of his ankle and also the medial aspect.  He reports that he is still at the doctor at work who gave him ibuprofen which patient reports helped a little bit.  He denies numbness or tingling.  Patient Active Problem List   Diagnosis Date Noted   Palpitations 01/26/2017   Paroxysmal tachycardia (HCC) 01/26/2017   Smoker 01/26/2017          Physical Exam   Triage Vital Signs: ED Triage Vitals  Encounter Vitals Group     BP 08/01/22 0808 128/83     Systolic BP Percentile --      Diastolic BP Percentile --      Pulse Rate 08/01/22 0808 94     Resp 08/01/22 0808 16     Temp 08/01/22 0808 97.9 F (36.6 C)     Temp Source 08/01/22 0808 Oral     SpO2 08/01/22 0808 97 %     Weight 08/01/22 0806 200 lb (90.7 kg)     Height 08/01/22 0806 5\' 5"  (1.651 m)     Head Circumference --      Peak Flow --      Pain Score 08/01/22 0805 10     Pain Loc --      Pain Education --      Exclude from Growth Chart --     Most recent vital signs: Vitals:   08/01/22 0808  BP: 128/83  Pulse: 94  Resp: 16  Temp: 97.9 F (36.6 C)  SpO2: 97%    Physical Exam Vitals and nursing note reviewed.  Constitutional:      General: Awake and alert. No acute distress.    Appearance: Normal appearance. The patient is normal weight.  HENT:     Head: Normocephalic and atraumatic.     Mouth: Mucous membranes are moist.  Eyes:     General: PERRL. Normal EOMs        Right eye: No discharge.        Left eye: No  discharge.     Conjunctiva/sclera: Conjunctivae normal.  Cardiovascular:     Rate and Rhythm: Normal rate and regular rhythm.     Pulses: Normal pulses.  Pulmonary:     Effort: Pulmonary effort is normal. No respiratory distress.     Breath sounds: Normal breath sounds.  Abdominal:     Abdomen is soft. There is no abdominal tenderness. No rebound or guarding. No distention. Musculoskeletal:        General: No swelling. Normal range of motion.     Cervical back: Normal range of motion and neck supple.  Left ankle: Medial surgical incision noted, old and well healed. Tenderness without swelling over the anterior talofibular ligament. No lateral or medial malleolar tenderness or proximal fifth metacarpal tenderness. No proximal fibular tenderness. 2+ pedal pulses with brisk capillary refill. Intact distal sensation and strength with normal ROM. Able to plantar flex and  dorsiflex against resistance. Able to invert and evert against resistance. Negative  dorsiflexion external rotation test. Negative squeeze test. Negative Thompson test Skin:    General: Skin is warm and dry.     Capillary Refill: Capillary refill takes less than 2 seconds.     Findings: No rash.  Neurological:     Mental Status: The patient is awake and alert.      ED Results / Procedures / Treatments   Labs (all labs ordered are listed, but only abnormal results are displayed) Labs Reviewed - No data to display   EKG     RADIOLOGY I independently reviewed and interpreted imaging and agree with radiologists findings.     PROCEDURES:  Critical Care performed:   Procedures   MEDICATIONS ORDERED IN ED: Medications  acetaminophen (TYLENOL) tablet 650 mg (650 mg Oral Given 08/01/22 0857)     IMPRESSION / MDM / ASSESSMENT AND PLAN / ED COURSE  I reviewed the triage vital signs and the nursing notes.   Differential diagnosis includes, but is not limited to, arthritis, sprain, fracture.  Patient is awake  and alert, hemodynamically stable and neurovascularly intact.  Patient has tenderness to palpation over medial malleoli are area as well as the anterior aspect of ankle.   There is no evidence of fracture on x-ray though patient does have arthritic changes and postoperative changes.  There is no tenderness to proximal fibula that would be concerning for occult fracture.  There is no knee pain or swelling and no ligamental laxity, do not suspect knee injury.  There is no proximal fifth metatarsal tenderness concerning for Jones fracture.  Negative squeeze test so do not suspect high ankle sprain.  Negative Thompson test, do not suspect Achilles tendon rupture. Patient is able to bear weight with pain.  Patient was given ankle splint.  We discussed [place] and outpatient follow-up.  Patient was treated symptomatically in the emergency department with improvement of symptoms.  We discussed no sports until ankle heals.  Patient understands and agrees with plan.   Patient's presentation is most consistent with acute complicated illness / injury requiring diagnostic workup.      FINAL CLINICAL IMPRESSION(S) / ED DIAGNOSES   Final diagnoses:  Acute left ankle pain     Rx / DC Orders   ED Discharge Orders     None        Note:  This document was prepared using Dragon voice recognition software and may include unintentional dictation errors.   Keturah Shavers 08/01/22 0933    Willy Eddy, MD 08/01/22 1109

## 2022-08-01 NOTE — ED Triage Notes (Signed)
Patient reports yesterday having left ankle soreness at work. Once he got home, the pain became severe. Patient limping when ambulating

## 2022-08-01 NOTE — ED Notes (Signed)
Pt has aircast on from provider placement; pt has cane at home.

## 2022-08-01 NOTE — Discharge Instructions (Signed)
You were seen in the Emergency Department for ankle injury. Your x-ray showed no fractures or dislocations though does show arthritis changes from your previous surgery. If you continue to have pain you might need to seek more medical attention and follow up with your primary care doctor or an orthopedist as needed. -- Use ice for comfort for 30 minutes 4 or 5 times a day to help reduce pain and swelling. -- Keep your ankle elevated to reduce swelling. -- You may bear weight as tolerated. -- You can use crutches and cane for comfort. -- Take Tylenol or Ibuprofen for pain. -- You can use an ace wrap for comfort as well. -- Please follow up with your primary care doctor or orthopedics as needed.

## 2023-01-15 IMAGING — CT CT HEAD W/O CM
3 series · 15 of 47 positions shown, 18 images · non-contrast
Comparison: None.

CLINICAL DATA: Numbness in ring finger, tongue numbness, headache

EXAM:
CT HEAD WITHOUT CONTRAST
TECHNIQUE: Contiguous axial images were obtained from the base of the skull
through the vertex without intravenous contrast.

[Series 2: head wo · axial · 0.43mm/px · z∈[-132,-7]mm · 9 of 31 slices shown, 12 images]
[im 3/31  brain]
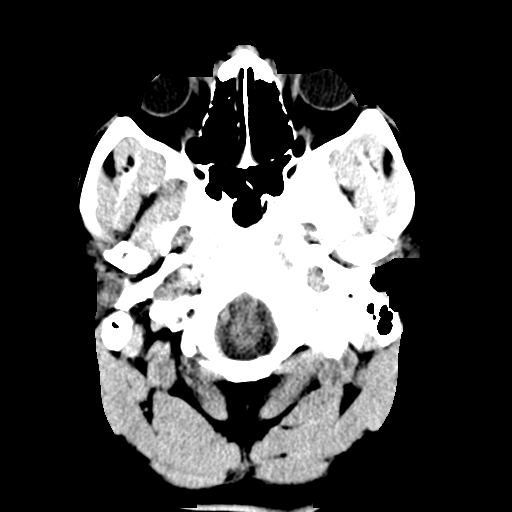
[im 3/31  bone]
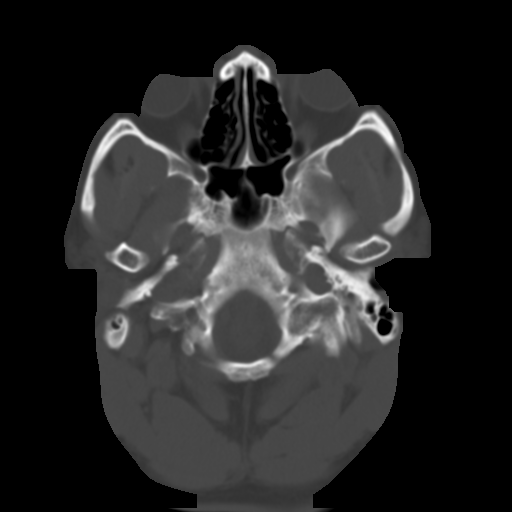
[im 6/31  brain]
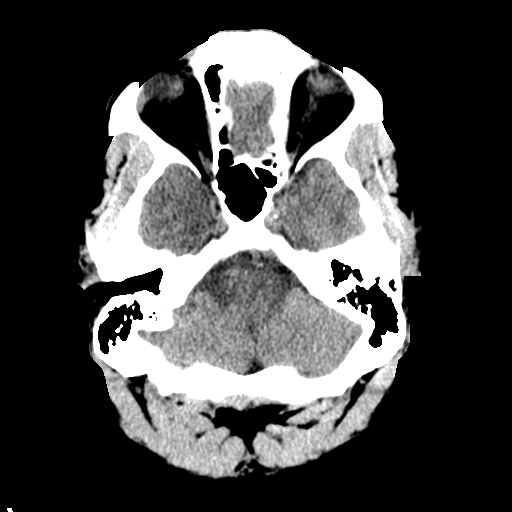
[im 9/31  brain]
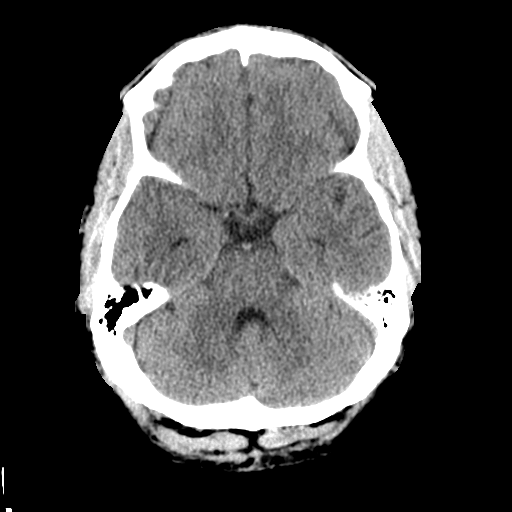
[im 12/31  brain]
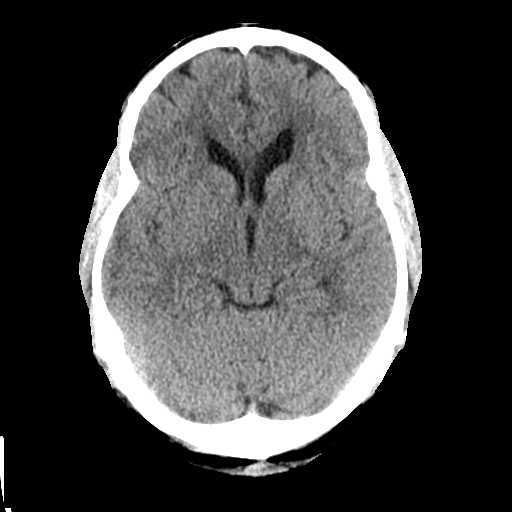
[im 16/31  brain]
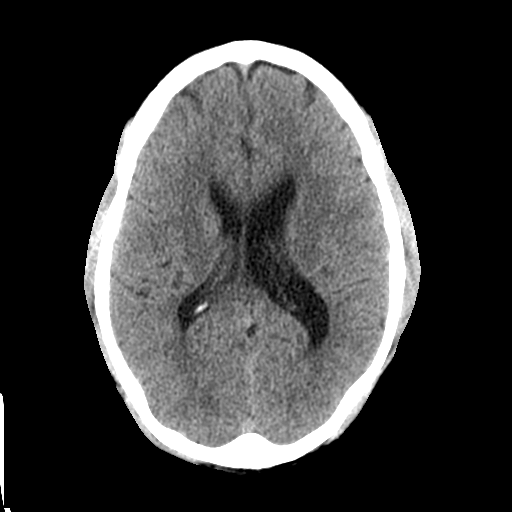
[im 16/31  bone]
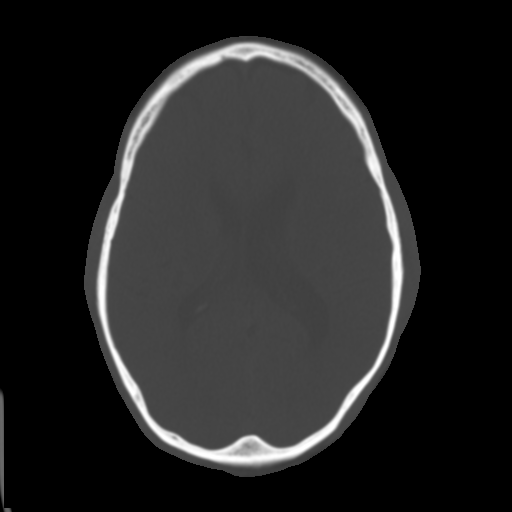
[im 19/31  brain]
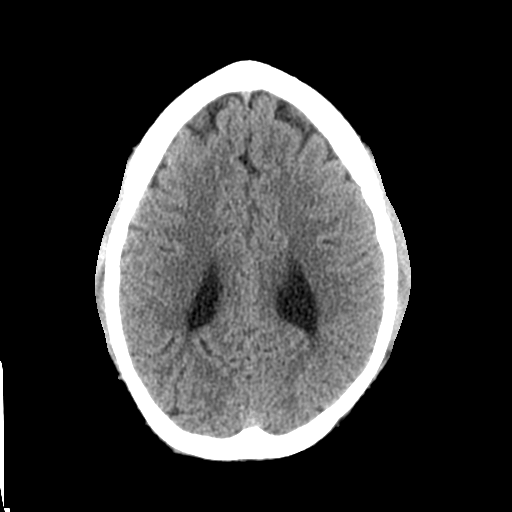
[im 22/31  brain]
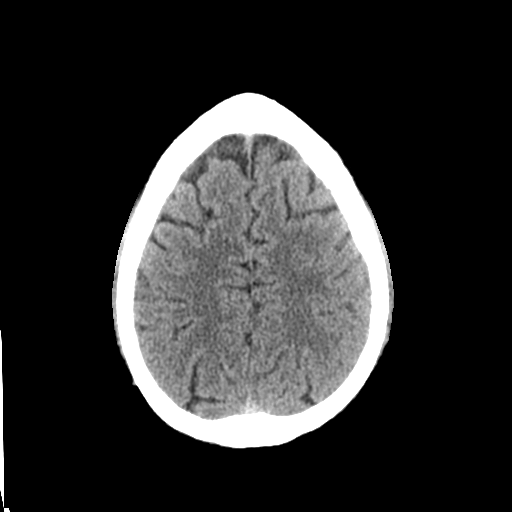
[im 25/31  brain]
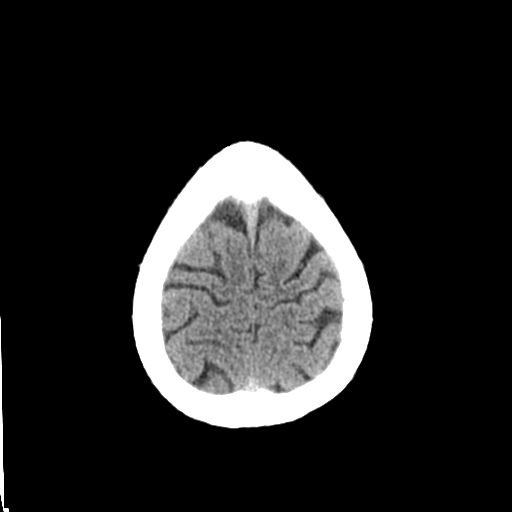
[im 28/31  brain]
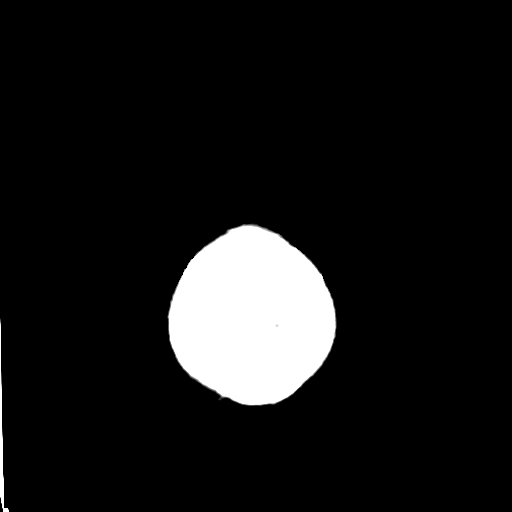
[im 28/31  bone]
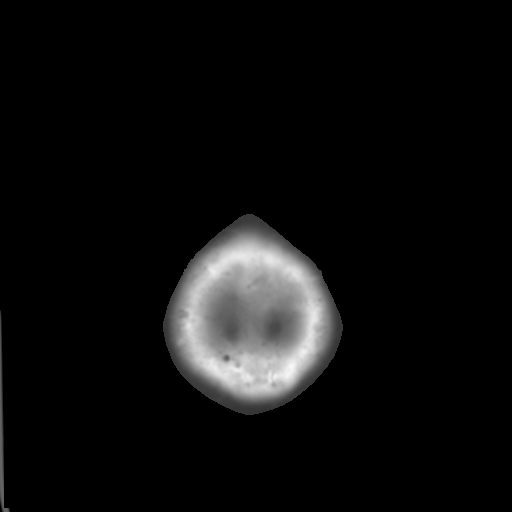

[Series 4: coronal soft tissue · coronal · 0.30mm/px · 3 of 68 slices shown]
[im 23/68  brain]
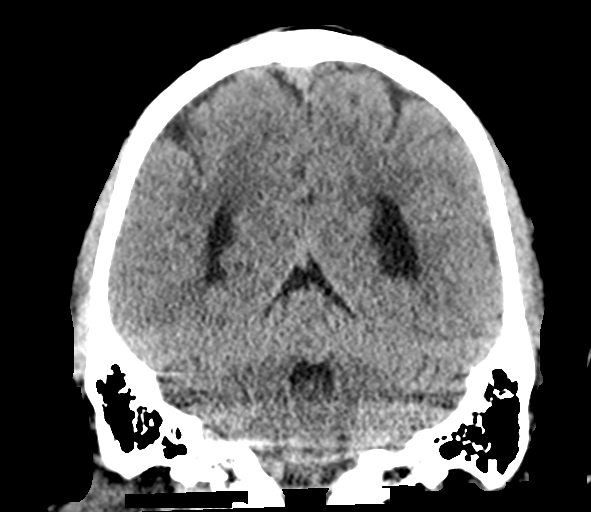
[im 30/68  brain]
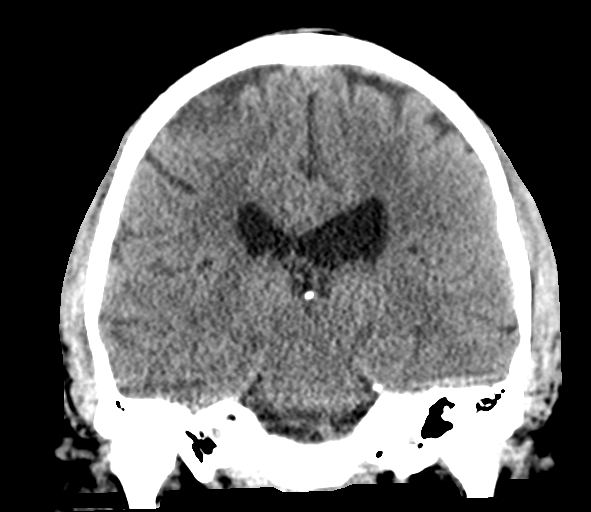
[im 38/68  brain]
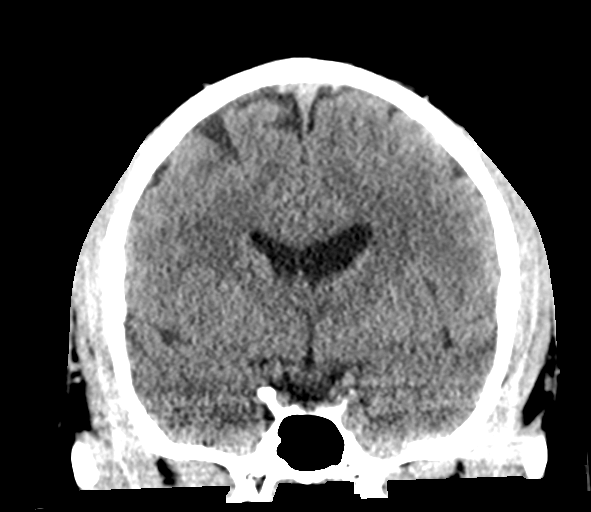

[Series 5: sagittal soft tissue · sagittal · 0.30mm/px · 3 of 60 slices shown]
[im 20/60  brain]
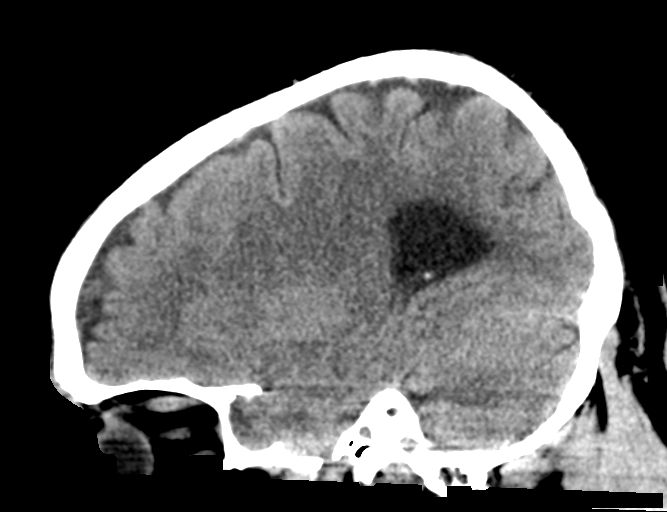
[im 30/60  brain]
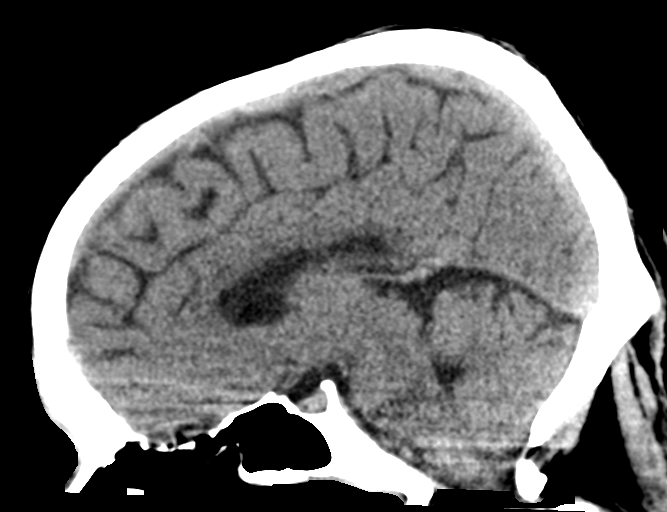
[im 40/60  brain]
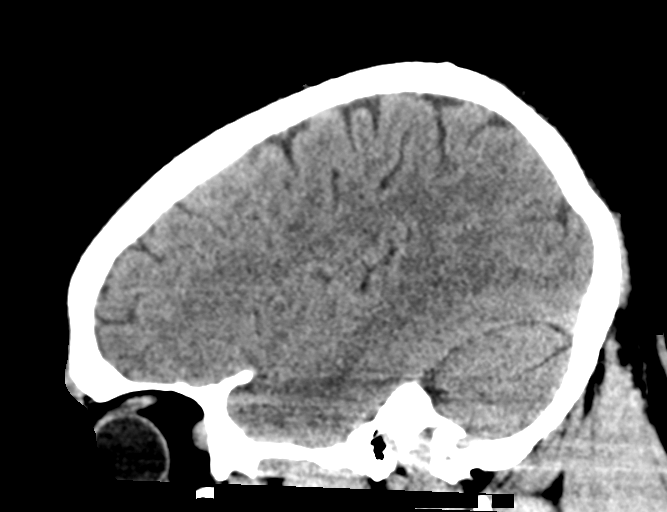

[15 of 47 positions shown; findings below may reference images not displayed]

FINDINGS: Brain: There is no acute intracranial hemorrhage, extra-axial fluid
collection, or infarct.

The ventricles are normal in size. There is no mass lesion. There is
no midline shift.

Vascular: No hyperdense vessel or unexpected calcification.

Skull: Normal. Negative for fracture or focal lesion.

Sinuses/Orbits: The imaged paranasal sinuses are clear. There is a
dysconjugate gaze. The globes and orbits are otherwise unremarkable.

Other: None.
IMPRESSION: 1. No acute intracranial pathology.
2. Dysconjugate gaze.

## 2023-03-13 IMAGING — CR DG CHEST 2V
1 series · 2 of 2 positions shown · non-contrast
Comparison: Chest x-ray 06/08/2020

CLINICAL DATA: Palpitations

EXAM:
CHEST - 2 VIEW

[Series 1: dg chest 2 view · 0.14mm/px · 2 of 2 slices shown]
[im 1/2]
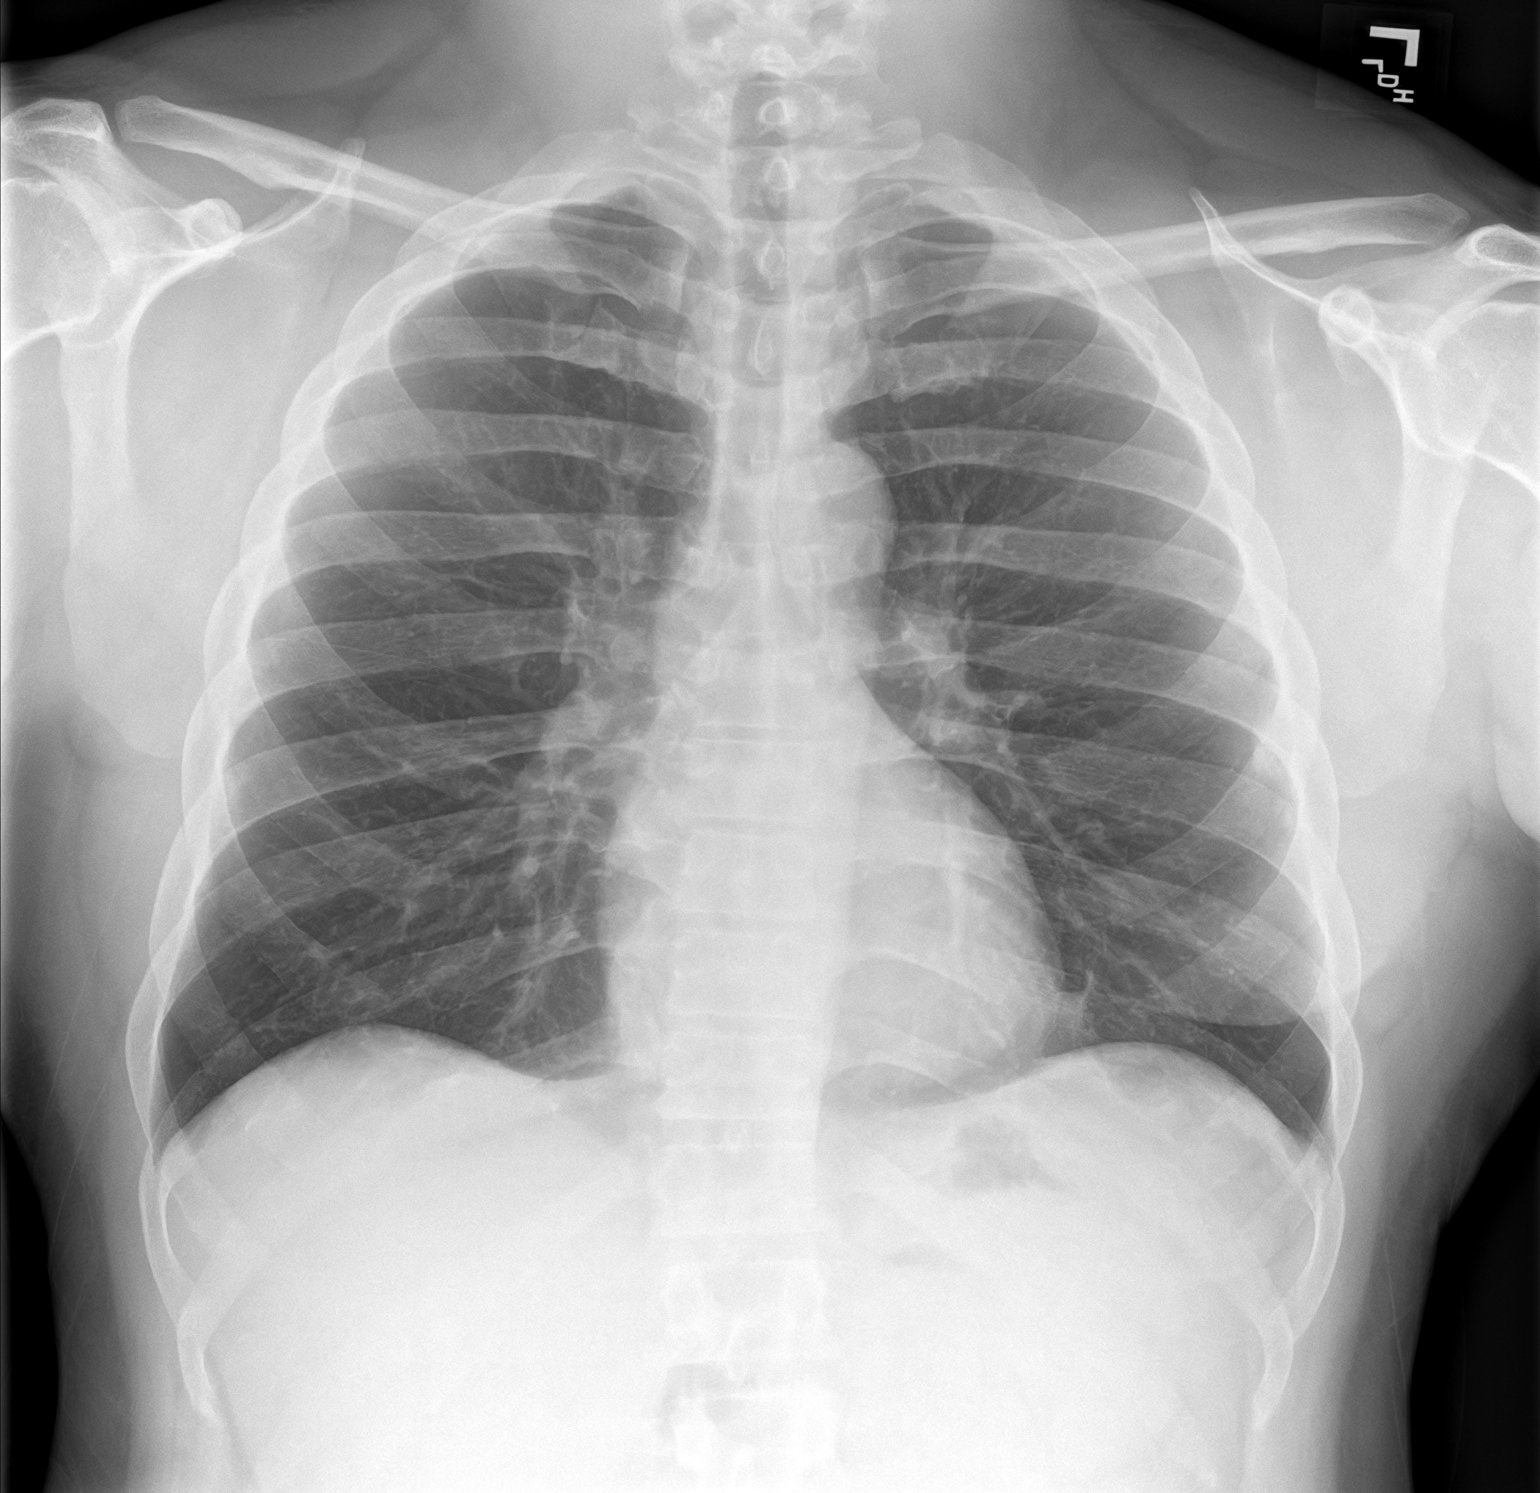
[im 2/2]
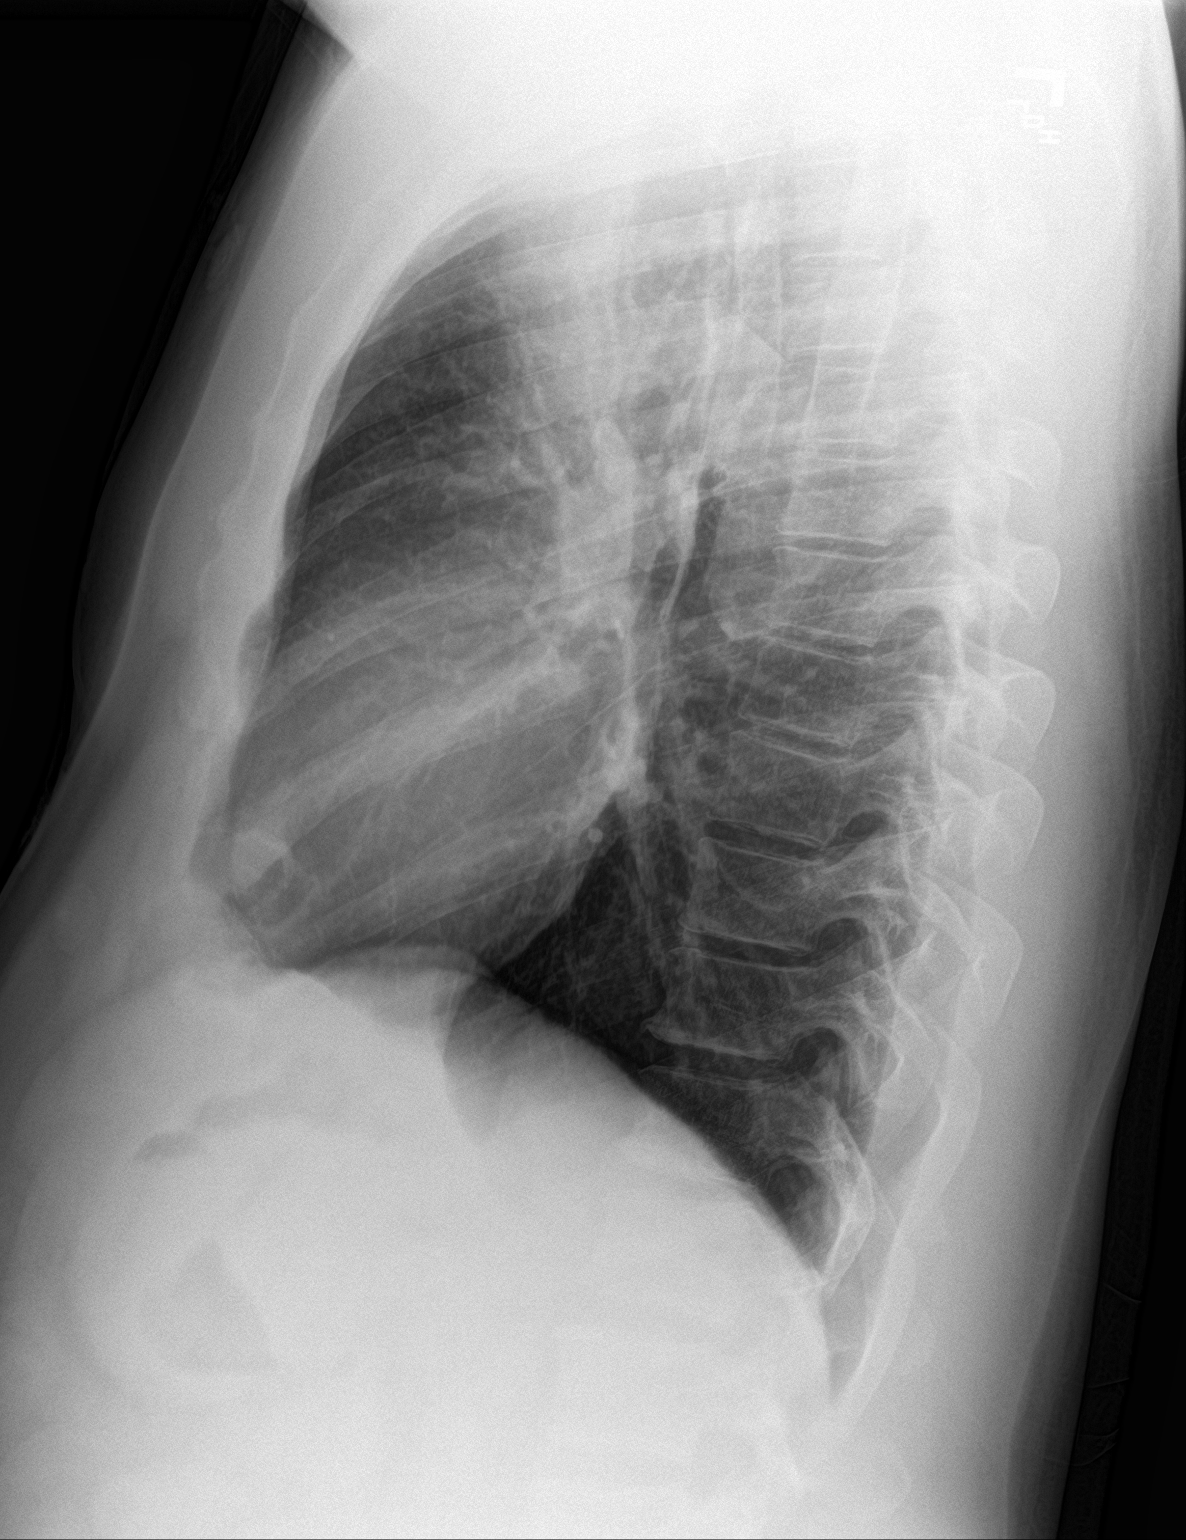

[2 of 2 positions shown; findings below may reference images not displayed]

FINDINGS: Heart size and mediastinal contours are within normal limits. Small
chronic opacity in the lateral aspect of the left mid lung zone as
seen on previous studies. No new pulmonary opacities identified.

No pleural effusion or pneumothorax visualized.

No acute osseous abnormality appreciated.
IMPRESSION: No acute intrathoracic process identified.
